# Patient Record
Sex: Female | Born: 1965
Health system: Southern US, Community
[De-identification: ages and names within clinical notes are randomized; demographics above are authoritative.]

## PROBLEM LIST (undated history)

## (undated) DIAGNOSIS — N200 Calculus of kidney: Secondary | ICD-10-CM

## (undated) DIAGNOSIS — M069 Rheumatoid arthritis, unspecified: Secondary | ICD-10-CM

## (undated) DIAGNOSIS — I10 Essential (primary) hypertension: Secondary | ICD-10-CM

## (undated) DIAGNOSIS — E079 Disorder of thyroid, unspecified: Secondary | ICD-10-CM

## (undated) HISTORY — DX: Rheumatoid arthritis, unspecified: M06.9

## (undated) HISTORY — DX: Calculus of kidney: N20.0

## (undated) HISTORY — PX: ABDOMINAL HYSTERECTOMY: SHX81

## (undated) HISTORY — DX: Disorder of thyroid, unspecified: E07.9

## (undated) HISTORY — PX: CHOLECYSTECTOMY: SHX55

## (undated) HISTORY — DX: Essential (primary) hypertension: I10

---

## 1999-11-10 ENCOUNTER — Other Ambulatory Visit: Admission: RE | Admit: 1999-11-10 | Discharge: 1999-11-10 | Payer: Self-pay | Admitting: Obstetrics and Gynecology

## 1999-11-29 ENCOUNTER — Encounter (INDEPENDENT_AMBULATORY_CARE_PROVIDER_SITE_OTHER): Payer: Self-pay | Admitting: Specialist

## 1999-11-29 ENCOUNTER — Ambulatory Visit (HOSPITAL_COMMUNITY): Admission: RE | Admit: 1999-11-29 | Discharge: 1999-11-29 | Payer: Self-pay | Admitting: Obstetrics and Gynecology

## 2000-12-06 ENCOUNTER — Other Ambulatory Visit: Admission: RE | Admit: 2000-12-06 | Discharge: 2000-12-06 | Payer: Self-pay | Admitting: Obstetrics and Gynecology

## 2001-01-14 ENCOUNTER — Encounter (INDEPENDENT_AMBULATORY_CARE_PROVIDER_SITE_OTHER): Payer: Self-pay | Admitting: Specialist

## 2001-01-14 ENCOUNTER — Inpatient Hospital Stay (HOSPITAL_COMMUNITY): Admission: RE | Admit: 2001-01-14 | Discharge: 2001-01-16 | Payer: Self-pay | Admitting: Obstetrics and Gynecology

## 2002-02-11 ENCOUNTER — Other Ambulatory Visit: Admission: RE | Admit: 2002-02-11 | Discharge: 2002-02-11 | Payer: Self-pay | Admitting: Obstetrics and Gynecology

## 2004-12-12 ENCOUNTER — Other Ambulatory Visit: Admission: RE | Admit: 2004-12-12 | Discharge: 2004-12-12 | Payer: Self-pay | Admitting: Obstetrics and Gynecology

## 2010-09-23 NOTE — Discharge Summary (Signed)
[facility]  Patient:    Hannah Lane, Hannah Lane Visit Number: [id number] MRN: [id number]          Service Type: GYN Location: 9300 [id number] Attending Physician:  Osborn Coho Dictated by:   Janeece Riggers Dareen Piano, M.D. Admit Date:  01/14/2001 Disc. Date: 01/16/01                             Discharge Summary  DISCHARGE DIAGNOSES:          1. Menorrhagia.                               2. Severe anemia.  PROCEDURE:                    Total vaginal hysterectomy.  HISTORY OF PRESENT ILLNESS:   [name] is a 45 year old white female G2, P2 who presented for total vaginal hysterectomy on January 14, 2001.  For a complete description of the events which lead up to this, please see the dictated operative note.  The patient underwent a total vaginal hysterectomy without complication on January 14, 2001.  A complete description of this procedure can be found in the dictated operative note.  Patients preoperative hemoglobin was 8.2.  Her postoperative hemoglobin was 6.5.  Patient was ambulating without difficulty and eating a regular diet throughout the entire postoperative course.  The patients pathology is pending at the time of this dictation.  The patient remained afebrile.  She was discharged to home on postoperative day #2.  She was sent home with Tylox.  She will return to the office in four weeks. Dictated by:   Janeece Riggers Dareen Piano, M.D. Attending Physician:  Osborn Coho DD:  01/16/01 TD:  01/16/01 Job: [id number]

## 2010-09-23 NOTE — H&P (Signed)
[facility]  Patient:    Hannah Lane, Hannah Lane Visit Number: [id number] MRN: [id number]          Service Type: GYN Location: 9300 [id number] Attending Physician:  Osborn Coho Dictated by:   Janeece Riggers Dareen Piano, M.D. Proc. Date: 01/14/01 Admit Date:  01/14/2001                           History and Physical  HISTORY OF PRESENT ILLNESS:   Ms. Tiegs is a 45 year old white female G 2, P 2, who presents today for a total vaginal hysterectomy secondary to menorrhagia causing severe anemia.  The patient has had problems with her menstrual cycles for five to seven years.  She has been treated with oral contraceptive pills, and a D&C with hysteroscopy.  The patient continues to bleed heavily despite using oral contraceptive pills.  She had little improvement after her D&C and hysteroscopy.  The hysteroscopy revealed no evidence of any polyps or uterine fibroids.  The patient does have a history of hypothyroidism, which is currently treated with Synthroid 75 mcg q.d.  The patient was offered a repeat D&C and hysteroscopy, endometrial ablation with roller ball, or a vaginal hysterectomy, and she desired to proceed on with a total vaginal hysterectomy.  The patients most recent hemoglobin is 8.0.  The patient complains of no bowel or bladder problems.  PAST MEDICAL HISTORY:         The patient has had two vaginal deliveries without complications.  The D&C and hysteroscopy, as listed above.  ALLERGIES:                    PENICILLIN.  SOCIAL HISTORY:               She denies smoking or drug use.  She uses alcohol occasionally.  FAMILY HISTORY:               The patient has a positive family history for hypertension, ovarian carcinoma, and cardiovascular disease.  PHYSICAL EXAMINATION:  VITAL SIGNS:                  Stable.  She is afebrile.  HEENT:                        Within normal limits.  LUNGS:                        Clear to  auscultation.  CARDIOVASCULAR:               A regular rate and rhythm without murmurs.  BREASTS:                      Without masses or tenderness.  There is no lymphadenopathy or lesion.  ABDOMEN:                      Soft, nontender, nondistended.  No palpable masses or organomegaly.  PELVIC:                       Normal external genitalia.  Vagina is without lesions or discharge.  Cervix is parous.  Uterus is anteverted, nontender. Normal size and shape.  There are no adnexal masses.  EXTREMITIES:  Within normal limits.  SKIN:                         Within normal limits.  IMPRESSION:                   1. Menorrhagia.                               2. Severe anemia.  PLAN:                         Proceed with a total vaginal hysterectomy. Dictated by:   Janeece Riggers Dareen Piano, M.D. Attending Physician:  Osborn Coho DD:  01/14/01 TD:  01/14/01 Job: [id number]

## 2010-09-23 NOTE — Op Note (Signed)
[facility]  Patient:    Hannah Lane, Hannah Lane                         MRN: [id number] Proc. Date: 11/29/99 Adm. Date:  [id number] Disc. Date: [id number] Attending:  Osborn Coho                           Operative Report  PREOPERATIVE DIAGNOSIS:       1. Menorrhagia.                               2. Severe anemia.  POSTOPERATIVE DIAGNOSIS:      1. Menorrhagia.                               2. Severe anemia.  PROCEDURE:                    1. Hysteroscopy.                               2. Dilatation and curettage.  SURGEON:                      Mark E. Dareen Piano, M.D.  ANESTHESIA:                   MAC with paracervical block.  ESTIMATED BLOOD LOSS:         25 cc.  COMPLICATIONS:                None.  SPECIMENS:                    Endocervical and endometrial curettings sent to pathology.  DESCRIPTION OF PROCEDURE:     The patient was taken to the operating room where she was placed in dorsal lithotomy position.  The MAC anesthesia was administered.  The patient was prepped with Hibiclens and her bladder was drained with a red rubber catheter.  She was prepped in the usual fashion for this procedure.  A sterile speculum was placed in the vagina.  A single-tooth tenaculum was applied to the anterior cervical lip.  Then 20 cc of 1% lidocaine used for a paracervical block.  The uterus was sounded at 9 cm.  The cervix was then dilated to a #29 [place].  A hysteroscope was placed into the endocervical canal which appeared to be normal.  While entering the uterine cavity, both ostia were visualized.  There was no evidence of any endometrial polyps or fibroids.  However, the endometrial lining was very thickened and tissue was seen heaped in several layers.  At this point, the hysteroscope was removed and an endocervical curettage was performed.  This was followed by an endometrial curetting where copious amounts of endometrial tissue was obtained.   All tissue was sent to pathology.  The patient tolerated the procedure well.  She was taken to the recovery room in stable condition. Instrument lap counts were correct x1.  The patient was given clindamycin ______ mg IV preoperatively. DD:  11/29/99 TD:  12/01/99 Job: [id number]

## 2010-09-23 NOTE — Op Note (Signed)
[facility]  Patient:    Hannah Lane, Hannah Lane Visit Number: [id number] MRN: [id number]          Service Type: GYN Location: 9300 [id number] Attending Physician:  Osborn Coho Dictated by:   Janeece Riggers Dareen Piano, M.D. Proc. Date: 01/14/01 Admit Date:  01/14/2001                             Operative Report  PREOPERATIVE DIAGNOSES:       1. Menomenorrhagia.                               2. Severe anemia.  POSTOPERATIVE DIAGNOSES:      1. Menomenorrhagia.                               2. Severe anemia.  PROCEDURE:                    Total vaginal hysterectomy.  SURGEON:                      Mark E. Dareen Piano, M.D.  ASSIST:                       Luvenia Redden, M.D.  ANESTHESIA:                   General.  ANTIBIOTICS:                  Ancef 1 g.  DRAINS:                       Foley to bed side drainage.  SPECIMEN:                     Cervix and uterus sent to pathology.  ESTIMATED BLOOD LOSS:         100 cc.  COMPLICATIONS:                None.  PROCEDURE:                    Patient was taken to the operating room where she was placed in the dorsal supine position.  A general anesthetic was administered without complications.  She was then placed in the dorsal lithotomy position and prepped with Hibiclens.  Her bladder was drained with a Red rubber catheter.  She was then draped in the usual fashion for this procedure.  A sterile speculum was placed in her vagina.  Single tooth tenaculum applied to the cervix.  The cervix was then injected with lidocaine with epinephrine.  The posterior cul-de-sac was then entered sharply.  The uterosacral ligaments were then bilaterally clamped, cut, and ligated with 0 Monocryl suture.  The anterior cul-de-sac was entered.  Cardinal ligaments were then sterilely clamped, cut, and ligated with 0 Monocryl suture.  The uterine vessels were bilaterally clamped, cut, and ligated with 0 Monocryl suture.  Once the  level of the round ligament was reached the uterus was inverted and the triple pedicle including the ovarian ligament, round ligament, fallopian tube were bilaterally clamped, cut and a specimen handed away.  These pedicles were then doubly ligated with 0 Monocryl suture.  All pedicles were then checked  and felt to be hemostatic.  The uterosacral ligaments were then reapproximated in the midline with 0 Monocryl suture.  The ovaries were inspected and appeared to be normal bilaterally.  The vaginal cuff was then closed using 2-0 Vicryl in a running locking fashion.  Patient tolerated the procedure well.  She was taken to the recovery room in stable condition.  Instrument, lap counts were correct x 2. Dictated by:   Janeece Riggers Dareen Piano, M.D. Attending Physician:  Osborn Coho DD:  01/14/01 TD:  01/14/01 Job: [id number]

## 2016-02-08 ENCOUNTER — Encounter: Payer: Self-pay | Admitting: Physician Assistant

## 2016-02-08 ENCOUNTER — Ambulatory Visit (INDEPENDENT_AMBULATORY_CARE_PROVIDER_SITE_OTHER): Payer: 59 | Admitting: Physician Assistant

## 2016-02-08 VITALS — BP 138/86 | HR 70 | Temp 98.1°F | Ht 63.0 in | Wt 221.5 lb

## 2016-02-08 DIAGNOSIS — M069 Rheumatoid arthritis, unspecified: Secondary | ICD-10-CM | POA: Insufficient documentation

## 2016-02-08 DIAGNOSIS — M0579 Rheumatoid arthritis with rheumatoid factor of multiple sites without organ or systems involvement: Secondary | ICD-10-CM

## 2016-02-08 DIAGNOSIS — M25561 Pain in right knee: Secondary | ICD-10-CM

## 2016-02-08 DIAGNOSIS — E538 Deficiency of other specified B group vitamins: Secondary | ICD-10-CM | POA: Diagnosis not present

## 2016-02-08 DIAGNOSIS — G8929 Other chronic pain: Secondary | ICD-10-CM

## 2016-02-08 DIAGNOSIS — E039 Hypothyroidism, unspecified: Secondary | ICD-10-CM

## 2016-02-08 DIAGNOSIS — I10 Essential (primary) hypertension: Secondary | ICD-10-CM | POA: Diagnosis not present

## 2016-02-08 NOTE — Progress Notes (Signed)
BP 138/86   Pulse 70   Temp 98.1 F (36.7 C) (Oral)   Ht 5\' 3"  (1.6 m)   Wt 221 lb 8 oz (100.5 kg)   BMI 39.24 kg/m    Subjective:    Patient ID: , female    DOB: 1966-02-12, 50 y.o.   MRN: [id number]  Hannah Lane is a 50 y.o. female presenting on 02/08/2016 for Knee Pain (right knee pain x 2 months, gets tight and stiff, seems to be worse when she lies or sits for long times)  HPI Patient here to be established as new patient at [facility].  This patient is known to me from [facility]. This patient has had chronic right knee pain for about 2 months. She takes Mobic and this keeps the rheumatoid arthritis calmed down. She is a patient at [facility] rheumatology. While she was there dermatologist felt that this knee was more isolated and more than likely due to some osteoarthritis. She has been icing it regularly. She has lost weight trying to have less pressure on it. It is very stiff after she has sat for very long. It is difficult to get in certain positions. She has no ability to the on her knees for work around her house. She is recently been taking some Tylenol in addition to the Mobic. She states that this has helped some. More than likely we need orthopedic consultation on this.  Patient is also here for recheck on her hypertension, hypothyroidism, vitamin B12 deficiency. All of her medications are reviewed today. There no refills needed at this time.   Relevant past medical, surgical, family and social history reviewed and updated as indicated. Interim medical history since our last visit reviewed. Allergies and medications reviewed and updated.   Data reviewed from any sources in EPIC.  Review of Systems  Constitutional: Negative.   HENT: Negative.   Eyes: Negative.   Respiratory: Negative.   Gastrointestinal: Negative.   Genitourinary: Negative.   Musculoskeletal: Positive for arthralgias, joint swelling and myalgias.     Per HPI unless specifically indicated above  Social History   Social History  . Marital status: Married    Spouse name: N/A  . Number of children: N/A  . Years of education: N/A   Occupational History  . Not on file.   Social History Main Topics  . Smoking status: Never Smoker  . Smokeless tobacco: Never Used  . Alcohol use No  . Drug use: No  . Sexual activity: Not on file   Other Topics Concern  . Not on file   Social History Narrative  . No narrative on file    Past Surgical History:  Procedure Laterality Date  . ABDOMINAL HYSTERECTOMY    . CHOLECYSTECTOMY      Family History  Problem Relation Age of Onset  . Diabetes Mother   . Hypertension Mother   . Kidney Stones Father   . Hydrocephalus Father   . Hypertension Brother   . Migraines Brother   . Kidney Stones Brother   . Parkinson's disease Maternal Grandmother   . Hypertension Maternal Grandmother   . Heart disease Maternal Grandmother   . Cancer Paternal Grandmother     ovarian  . Hydrocephalus Paternal Grandfather   . Hypertension Brother       Medication List       Accurate as of 02/08/16 10:28 AM. Always use your most recent med list.  levothyroxine 75 MCG tablet Commonly known as:  SYNTHROID, LEVOTHROID Take 75 mcg by mouth daily.   meloxicam 15 MG tablet Commonly known as:  MOBIC Take 15 mg by mouth daily.   multivitamin tablet Take 1 tablet by mouth daily.   valsartan 320 MG tablet Commonly known as:  DIOVAN Take 320 mg by mouth daily.   VITAMIN B-12 IJ Inject as directed every 30 (thirty) days.   XIIDRA 5 % Soln Generic drug:  Lifitegrast INSTILL 1 DROP TWICE A DAY INTO BOTH EYES          Objective:    BP 138/86   Pulse 70   Temp 98.1 F (36.7 C) (Oral)   Ht 5\' 3"  (1.6 m)   Wt 221 lb 8 oz (100.5 kg)   BMI 39.24 kg/m   No Known Allergies Wt Readings from Last 3 Encounters:  02/08/16 221 lb 8 oz (100.5 kg)    Physical Exam  Constitutional:  She is oriented to person, place, and time. She appears well-developed and well-nourished.  HENT:  Head: Normocephalic and atraumatic.  Eyes: Conjunctivae and EOM are normal. Pupils are equal, round, and reactive to light.  Cardiovascular: Normal rate, regular rhythm, normal heart sounds and intact distal pulses.   Pulmonary/Chest: Effort normal and breath sounds normal.  Abdominal: Soft. Bowel sounds are normal.  Musculoskeletal:       Right knee: She exhibits decreased range of motion and swelling. She exhibits no deformity, no erythema, no LCL laxity, normal patellar mobility, no bony tenderness and no MCL laxity.       Legs: Patient reports mild swelling on the popliteal space when she is having a lot of pain.  Neurological: She is alert and oriented to person, place, and time. She has normal reflexes.  Skin: Skin is warm and dry. No rash noted.  Psychiatric: She has a normal mood and affect. Her behavior is normal. Judgment and thought content normal.        Assessment & Plan:   1. Chronic pain of right knee - Ambulatory referral to Orthopedic Surgery  2. HTN (hypertension), benign - valsartan (DIOVAN) 320 MG tablet; Take 320 mg by mouth daily.  3. Rheumatoid arthritis involving multiple sites with positive rheumatoid factor (HCC) - meloxicam (MOBIC) 15 MG tablet; Take 15 mg by mouth daily.; Refill: 3  4. Hypothyroidism, unspecified type - levothyroxine (SYNTHROID, LEVOTHROID) 75 MCG tablet; Take 75 mcg by mouth daily.  5. Morbid obesity (HCC) Continue ketogenic type diet which is allow the patient to lose almost 20 pounds.  6. Vitamin B12 deficiency - Cyanocobalamin (VITAMIN B-12 IJ); Inject as directed every 30 (thirty) days.   Continue all other maintenance medications as listed above. Educational handout given for knee pain.  Follow up plan: Return in about 6 months (around 08/08/2016) for recheck.  10/08/2016 PA-C [facility] [address]  [place], [place] [phone]   02/08/2016, 10:28 AM

## 2016-02-08 NOTE — Patient Instructions (Signed)

## 2016-05-03 ENCOUNTER — Telehealth: Payer: Self-pay | Admitting: Physician Assistant

## 2016-05-03 MED ORDER — LEVOFLOXACIN 500 MG PO TABS: 500.0000 mg | ORAL_TABLET | Freq: Every day | ORAL | 0 refills | Status: DC

## 2016-05-03 NOTE — Telephone Encounter (Signed)
Prescription sent to pharmacy.

## 2016-05-22 DIAGNOSIS — M064 Inflammatory polyarthropathy: Secondary | ICD-10-CM | POA: Diagnosis not present

## 2016-05-22 DIAGNOSIS — M797 Fibromyalgia: Secondary | ICD-10-CM | POA: Diagnosis not present

## 2016-05-22 DIAGNOSIS — E790 Hyperuricemia without signs of inflammatory arthritis and tophaceous disease: Secondary | ICD-10-CM | POA: Diagnosis not present

## 2016-05-29 ENCOUNTER — Telehealth: Payer: Self-pay | Admitting: Physician Assistant

## 2016-05-29 MED ORDER — FLUCONAZOLE 150 MG PO TABS: 150.0000 mg | ORAL_TABLET | Freq: Once | ORAL | 2 refills | Status: AC

## 2016-05-29 NOTE — Telephone Encounter (Signed)
Prescription sent to pharmacy.

## 2016-06-20 ENCOUNTER — Other Ambulatory Visit: Payer: Self-pay | Admitting: Physician Assistant

## 2016-06-20 DIAGNOSIS — E039 Hypothyroidism, unspecified: Secondary | ICD-10-CM

## 2016-08-07 DIAGNOSIS — M064 Inflammatory polyarthropathy: Secondary | ICD-10-CM | POA: Diagnosis not present

## 2016-08-07 DIAGNOSIS — E790 Hyperuricemia without signs of inflammatory arthritis and tophaceous disease: Secondary | ICD-10-CM | POA: Diagnosis not present

## 2016-08-07 DIAGNOSIS — M2550 Pain in unspecified joint: Secondary | ICD-10-CM | POA: Diagnosis not present

## 2016-08-30 ENCOUNTER — Encounter: Payer: 59 | Admitting: Physician Assistant

## 2016-09-04 ENCOUNTER — Other Ambulatory Visit: Payer: Self-pay | Admitting: Physician Assistant

## 2016-09-13 ENCOUNTER — Ambulatory Visit (INDEPENDENT_AMBULATORY_CARE_PROVIDER_SITE_OTHER): Payer: 59 | Admitting: Physician Assistant

## 2016-09-13 ENCOUNTER — Encounter: Payer: Self-pay | Admitting: Physician Assistant

## 2016-09-13 VITALS — BP 120/70 | HR 72 | Temp 98.4°F | Ht 63.0 in | Wt 218.0 lb

## 2016-09-13 DIAGNOSIS — Z0000 Encounter for general adult medical examination without abnormal findings: Secondary | ICD-10-CM | POA: Diagnosis not present

## 2016-09-13 DIAGNOSIS — Z6841 Body Mass Index (BMI) 40.0 and over, adult: Secondary | ICD-10-CM | POA: Diagnosis not present

## 2016-09-13 DIAGNOSIS — E538 Deficiency of other specified B group vitamins: Secondary | ICD-10-CM

## 2016-09-13 DIAGNOSIS — I10 Essential (primary) hypertension: Secondary | ICD-10-CM

## 2016-09-13 MED ORDER — VALSARTAN 320 MG PO TABS: 320.0000 mg | ORAL_TABLET | Freq: Every day | ORAL | 3 refills | Status: DC

## 2016-09-13 MED ORDER — CYANOCOBALAMIN 1000 MCG/ML IJ SOLN: 1000.0000 ug | INTRAMUSCULAR | 3 refills | Status: DC

## 2016-09-13 NOTE — Patient Instructions (Signed)
Cyanocobalamin, Vitamin B12 injection What is this medicine? CYANOCOBALAMIN (sye an oh koe BAL a min) is a man made form of vitamin B12. Vitamin B12 is used in the growth of healthy blood cells, nerve cells, and proteins in the body. It also helps with the metabolism of fats and carbohydrates. This medicine is used to treat people who can not absorb vitamin B12. This medicine may be used for other purposes; ask your health care provider or pharmacist if you have questions. COMMON BRAND NAME(S): B-12 Compliance Kit, B-12 Injection Kit, Cyomin, LA-12, Nutri-Twelve, Physicians EZ Use B-12, Primabalt What should I tell my health care provider before I take this medicine? They need to know if you have any of these conditions: -kidney disease -Leber's disease -megaloblastic anemia -an unusual or allergic reaction to cyanocobalamin, cobalt, other medicines, foods, dyes, or preservatives -pregnant or trying to get pregnant -breast-feeding How should I use this medicine? This medicine is injected into a muscle or deeply under the skin. It is usually given by a health care professional in a clinic or doctor's office. However, your doctor may teach you how to inject yourself. Follow all instructions. Talk to your pediatrician regarding the use of this medicine in children. Special care may be needed. Overdosage: If you think you have taken too much of this medicine contact a poison control center or emergency room at once. NOTE: This medicine is only for you. Do not share this medicine with others. What if I miss a dose? If you are given your dose at a clinic or doctor's office, call to reschedule your appointment. If you give your own injections and you miss a dose, take it as soon as you can. If it is almost time for your next dose, take only that dose. Do not take double or extra doses. What may interact with this medicine? -colchicine -heavy alcohol intake This list may not describe all possible  interactions. Give your health care provider a list of all the medicines, herbs, non-prescription drugs, or dietary supplements you use. Also tell them if you smoke, drink alcohol, or use illegal drugs. Some items may interact with your medicine. What should I watch for while using this medicine? Visit your doctor or health care professional regularly. You may need blood work done while you are taking this medicine. You may need to follow a special diet. Talk to your doctor. Limit your alcohol intake and avoid smoking to get the best benefit. What side effects may I notice from receiving this medicine? Side effects that you should report to your doctor or health care professional as soon as possible: -allergic reactions like skin rash, itching or hives, swelling of the face, lips, or tongue -blue tint to skin -chest tightness, pain -difficulty breathing, wheezing -dizziness -red, swollen painful area on the leg Side effects that usually do not require medical attention (report to your doctor or health care professional if they continue or are bothersome): -diarrhea -headache This list may not describe all possible side effects. Call your doctor for medical advice about side effects. You may report side effects to FDA at 1-800-FDA-1088. Where should I keep my medicine? Keep out of the reach of children. Store at room temperature between 15 and 30 degrees C (59 and 85 degrees F). Protect from light. Throw away any unused medicine after the expiration date. NOTE: This sheet is a summary. It may not cover all possible information. If you have questions about this medicine, talk to your doctor, pharmacist, or   health care provider.  2018 Elsevier/Gold Standard (2007-08-05 22:10:20)  

## 2016-09-14 LAB — CBC WITH DIFFERENTIAL/PLATELET
BASOS ABS: 0.1 10*3/uL (ref 0.0–0.2)
Basos: 1 %
EOS (ABSOLUTE): 0.3 10*3/uL (ref 0.0–0.4)
Eos: 3 %
Hematocrit: 44.3 % (ref 34.0–46.6)
Hemoglobin: 14.5 g/dL (ref 11.1–15.9)
IMMATURE GRANS (ABS): 0 10*3/uL (ref 0.0–0.1)
IMMATURE GRANULOCYTES: 0 %
LYMPHS: 33 %
Lymphocytes Absolute: 3 10*3/uL (ref 0.7–3.1)
MCH: 29.1 pg (ref 26.6–33.0)
MCHC: 32.7 g/dL (ref 31.5–35.7)
MCV: 89 fL (ref 79–97)
Monocytes Absolute: 0.5 10*3/uL (ref 0.1–0.9)
Monocytes: 6 %
NEUTROS PCT: 57 %
Neutrophils Absolute: 5.2 10*3/uL (ref 1.4–7.0)
PLATELETS: 309 10*3/uL (ref 150–379)
RBC: 4.98 x10E6/uL (ref 3.77–5.28)
RDW: 12.7 % (ref 12.3–15.4)
WBC: 9 10*3/uL (ref 3.4–10.8)

## 2016-09-14 LAB — CMP14+EGFR
ALT: 18 IU/L (ref 0–32)
AST: 17 IU/L (ref 0–40)
Albumin/Globulin Ratio: 1.9 (ref 1.2–2.2)
Albumin: 4.5 g/dL (ref 3.5–5.5)
Alkaline Phosphatase: 104 IU/L (ref 39–117)
BUN/Creatinine Ratio: 21 (ref 9–23)
BUN: 13 mg/dL (ref 6–24)
Bilirubin Total: 0.2 mg/dL (ref 0.0–1.2)
CALCIUM: 9.7 mg/dL (ref 8.7–10.2)
CHLORIDE: 99 mmol/L (ref 96–106)
CO2: 25 mmol/L (ref 18–29)
Creatinine, Ser: 0.63 mg/dL (ref 0.57–1.00)
GFR, EST AFRICAN AMERICAN: 120 mL/min/{1.73_m2} (ref >59)
GFR, EST NON AFRICAN AMERICAN: 104 mL/min/{1.73_m2} (ref >59)
GLUCOSE: 89 mg/dL (ref 65–99)
Globulin, Total: 2.4 g/dL (ref 1.5–4.5)
Potassium: 4.3 mmol/L (ref 3.5–5.2)
Sodium: 138 mmol/L (ref 134–144)
TOTAL PROTEIN: 6.9 g/dL (ref 6.0–8.5)

## 2016-09-14 LAB — LIPID PANEL
CHOL/HDL RATIO: 4.2 ratio (ref 0.0–4.4)
Cholesterol, Total: 237 mg/dL — ABNORMAL HIGH (ref 100–199)
HDL: 56 mg/dL (ref >39)
LDL Calculated: 124 mg/dL — ABNORMAL HIGH (ref 0–99)
TRIGLYCERIDES: 283 mg/dL — AB (ref 0–149)
VLDL CHOLESTEROL CAL: 57 mg/dL — AB (ref 5–40)

## 2016-09-14 LAB — THYROID PANEL WITH TSH
FREE THYROXINE INDEX: 1.5 (ref 1.2–4.9)
T3 Uptake Ratio: 21 % — ABNORMAL LOW (ref 24–39)
T4, Total: 7.1 ug/dL (ref 4.5–12.0)
TSH: 3.64 u[IU]/mL (ref 0.450–4.500)

## 2016-09-14 LAB — VITAMIN B12: Vitamin B-12: 469 pg/mL (ref 232–1245)

## 2016-09-15 DIAGNOSIS — Z0000 Encounter for general adult medical examination without abnormal findings: Secondary | ICD-10-CM | POA: Insufficient documentation

## 2016-09-15 DIAGNOSIS — Z6841 Body Mass Index (BMI) 40.0 and over, adult: Secondary | ICD-10-CM

## 2016-09-15 NOTE — Progress Notes (Signed)
BP 120/70   Pulse 72   Temp 98.4 F (36.9 C) (Oral)   Ht 5' 3"  (1.6 m)   Wt 218 lb (98.9 kg)   BMI 38.62 kg/m    Subjective:    Patient ID: Hannah Lane, female    DOB: 05-Mar-1966, 51 y.o.   MRN: [id number]  HPI: Hannah Lane is a 51 y.o. female presenting on 09/13/2016 for Annual Exam (pt here today for CPE, no other concerns voiced.)  This patient comes in for annual well physical examination. All medications are reviewed today. There are no reports of any problems with the medications. All of the medical conditions are reviewed and updated.  Lab work is reviewed and will be ordered as medically necessary. There are no new problems reported with today's visit.  Patient reports doing well overall.  Past Medical History:  Diagnosis Date  . Hypertension   . Kidney stones   . Rheumatoid arthritis ([place])   . Thyroid disease    Relevant past medical, surgical, family and social history reviewed and updated as indicated. Interim medical history since our last visit reviewed. Allergies and medications reviewed and updated. DATA REVIEWED: CHART IN EPIC  Social History   Social History  . Marital status: Married    Spouse name: N/A  . Number of children: N/A  . Years of education: N/A   Occupational History  . Not on file.   Social History Main Topics  . Smoking status: Never Smoker  . Smokeless tobacco: Never Used  . Alcohol use No  . Drug use: No  . Sexual activity: Not on file   Other Topics Concern  . Not on file   Social History Narrative  . No narrative on file    Past Surgical History:  Procedure Laterality Date  . ABDOMINAL HYSTERECTOMY    . CHOLECYSTECTOMY      Family History  Problem Relation Age of Onset  . Diabetes Mother   . Hypertension Mother   . Kidney Stones Father   . Hydrocephalus Father   . Hypertension Brother   . Migraines Brother   . Kidney Stones Brother   . Parkinson's disease Maternal Grandmother   . Hypertension Maternal  Grandmother   . Heart disease Maternal Grandmother   . Cancer Paternal Grandmother        ovarian  . Hydrocephalus Paternal Grandfather   . Hypertension Brother     Review of Systems  Constitutional: Positive for fatigue. Negative for activity change and fever.  HENT: Negative.   Eyes: Negative.   Respiratory: Negative.  Negative for cough.   Cardiovascular: Negative.  Negative for chest pain.  Gastrointestinal: Positive for abdominal pain and diarrhea.  Endocrine: Negative.   Genitourinary: Negative.  Negative for dysuria.  Musculoskeletal: Positive for arthralgias, joint swelling and myalgias.  Skin: Negative.   Neurological: Negative.     Allergies as of 09/13/2016      Reactions   Penicillins Hives      Medication List       Accurate as of 09/13/16 11:59 PM. Always use your most recent med list.          cyanocobalamin 1000 MCG/ML injection Commonly known as:  (VITAMIN B-12) Inject 1 mL (1,000 mcg total) into the skin every 30 (thirty) days.   levothyroxine 75 MCG tablet Commonly known as:  SYNTHROID, LEVOTHROID TAKE ONE TABLET BY MOUTH ONE TIME DAILY   meloxicam 15 MG tablet Commonly known as:  MOBIC Take 15 mg  by mouth daily.   multivitamin tablet Take 1 tablet by mouth daily.   valsartan 320 MG tablet Commonly known as:  DIOVAN Take 1 tablet (320 mg total) by mouth daily.          Objective:    BP 120/70   Pulse 72   Temp 98.4 F (36.9 C) (Oral)   Ht 5' 3"  (1.6 m)   Wt 218 lb (98.9 kg)   BMI 38.62 kg/m   Allergies  Allergen Reactions  . Penicillins Hives    Wt Readings from Last 3 Encounters:  09/13/16 218 lb (98.9 kg)  02/08/16 221 lb 8 oz (100.5 kg)    Physical Exam  Constitutional: She is oriented to person, place, and time. She appears well-developed and well-nourished.  HENT:  Head: Normocephalic and atraumatic.  Right Ear: Tympanic membrane, external ear and ear canal normal.  Left Ear: Tympanic membrane, external ear and ear  canal normal.  Nose: Nose normal. No rhinorrhea.  Mouth/Throat: Oropharynx is clear and moist and mucous membranes are normal. No oropharyngeal exudate or posterior oropharyngeal erythema.  Eyes: Conjunctivae and EOM are normal. Pupils are equal, round, and reactive to light.  Neck: Normal range of motion. Neck supple.  Cardiovascular: Normal rate, regular rhythm, normal heart sounds and intact distal pulses.   Pulmonary/Chest: Effort normal and breath sounds normal.  Abdominal: Soft. Bowel sounds are normal.  Neurological: She is alert and oriented to person, place, and time. She has normal reflexes.  Skin: Skin is warm and dry. No rash noted.  Psychiatric: She has a normal mood and affect. Her behavior is normal. Judgment and thought content normal.  Nursing note and vitals reviewed.   Results for orders placed or performed in visit on 09/13/16  CBC with Differential/Platelet  Result Value Ref Range   WBC 9.0 3.4 - 10.8 x10E3/uL   RBC 4.98 3.77 - 5.28 x10E6/uL   Hemoglobin 14.5 11.1 - 15.9 g/dL   Hematocrit 44.3 34.0 - 46.6 %   MCV 89 79 - 97 fL   MCH 29.1 26.6 - 33.0 pg   MCHC 32.7 31.5 - 35.7 g/dL   RDW 12.7 12.3 - 15.4 %   Platelets 309 150 - 379 x10E3/uL   Neutrophils 57 Not Estab. %   Lymphs 33 Not Estab. %   Monocytes 6 Not Estab. %   Eos 3 Not Estab. %   Basos 1 Not Estab. %   Neutrophils Absolute 5.2 1.4 - 7.0 x10E3/uL   Lymphocytes Absolute 3.0 0.7 - 3.1 x10E3/uL   Monocytes Absolute 0.5 0.1 - 0.9 x10E3/uL   EOS (ABSOLUTE) 0.3 0.0 - 0.4 x10E3/uL   Basophils Absolute 0.1 0.0 - 0.2 x10E3/uL   Immature Granulocytes 0 Not Estab. %   Immature Grans (Abs) 0.0 0.0 - 0.1 x10E3/uL  CMP14+EGFR  Result Value Ref Range   Glucose 89 65 - 99 mg/dL   BUN 13 6 - 24 mg/dL   Creatinine, Ser 0.63 0.57 - 1.00 mg/dL   GFR calc non Af Amer 104 >59 mL/min/1.73   GFR calc Af Amer 120 >59 mL/min/1.73   BUN/Creatinine Ratio 21 9 - 23   Sodium 138 134 - 144 mmol/L   Potassium 4.3 3.5 -  5.2 mmol/L   Chloride 99 96 - 106 mmol/L   CO2 25 18 - 29 mmol/L   Calcium 9.7 8.7 - 10.2 mg/dL   Total Protein 6.9 6.0 - 8.5 g/dL   Albumin 4.5 3.5 - 5.5 g/dL   Globulin,  Total 2.4 1.5 - 4.5 g/dL   Albumin/Globulin Ratio 1.9 1.2 - 2.2   Bilirubin Total 0.2 0.0 - 1.2 mg/dL   Alkaline Phosphatase 104 39 - 117 IU/L   AST 17 0 - 40 IU/L   ALT 18 0 - 32 IU/L  Lipid panel  Result Value Ref Range   Cholesterol, Total 237 (H) 100 - 199 mg/dL   Triglycerides 283 (H) 0 - 149 mg/dL   HDL 56 >39 mg/dL   VLDL Cholesterol Cal 57 (H) 5 - 40 mg/dL   LDL Calculated 124 (H) 0 - 99 mg/dL   Chol/HDL Ratio 4.2 0.0 - 4.4 ratio  Thyroid Panel With TSH  Result Value Ref Range   TSH 3.640 0.450 - 4.500 uIU/mL   T4, Total 7.1 4.5 - 12.0 ug/dL   T3 Uptake Ratio 21 (L) 24 - 39 %   Free Thyroxine Index 1.5 1.2 - 4.9  Vitamin B12  Result Value Ref Range   Vitamin B-12 469 232 - 1,245 pg/mL      Assessment & Plan:   1. Well adult exam - CBC with Differential/Platelet - CMP14+EGFR - Lipid panel - Thyroid Panel With TSH - Vitamin B12  2. Vitamin B12 deficiency - cyanocobalamin (,VITAMIN B-12,) 1000 MCG/ML injection; Inject 1 mL (1,000 mcg total) into the skin every 30 (thirty) days.  Dispense: 3 mL; Refill: 3  3. HTN (hypertension), benign - valsartan (DIOVAN) 320 MG tablet; Take 1 tablet (320 mg total) by mouth daily.  Dispense: 90 tablet; Refill: 3  4. Class 3 severe obesity Lane to excess calories with serious comorbidity and body mass index (BMI) of 40.0 to 44.9 in adult [facility]) - Amb Referral to Bariatric Surgery   Current Outpatient Prescriptions:  .  cyanocobalamin (,VITAMIN B-12,) 1000 MCG/ML injection, Inject 1 mL (1,000 mcg total) into the skin every 30 (thirty) days., Disp: 3 mL, Rfl: 3 .  levothyroxine (SYNTHROID, LEVOTHROID) 75 MCG tablet, TAKE ONE TABLET BY MOUTH ONE TIME DAILY, Disp: 30 tablet, Rfl: 11 .  meloxicam (MOBIC) 15 MG tablet, Take 15 mg by mouth daily., Disp: , Rfl: 3 .   Multiple Vitamin (MULTIVITAMIN) tablet, Take 1 tablet by mouth daily., Disp: , Rfl:  .  valsartan (DIOVAN) 320 MG tablet, Take 1 tablet (320 mg total) by mouth daily., Disp: 90 tablet, Rfl: 3  Continue all other maintenance medications as listed above.  Follow up plan: Return in about 6 months (around 03/16/2017) for recheck med.  Educational handout given for B12 deficiency  Terald Sleeper PA-C [place] [address]  [place], [place] [address] [phone]   09/15/2016, 10:37 AM

## 2016-11-16 DIAGNOSIS — M0609 Rheumatoid arthritis without rheumatoid factor, multiple sites: Secondary | ICD-10-CM | POA: Diagnosis not present

## 2016-11-16 DIAGNOSIS — E790 Hyperuricemia without signs of inflammatory arthritis and tophaceous disease: Secondary | ICD-10-CM | POA: Diagnosis not present

## 2016-11-16 DIAGNOSIS — M797 Fibromyalgia: Secondary | ICD-10-CM | POA: Diagnosis not present

## 2016-11-24 ENCOUNTER — Telehealth: Payer: Self-pay | Admitting: Physician Assistant

## 2016-11-24 MED ORDER — "NEEDLE (DISP) 25G X 1"" MISC": 1.0000 | 3 refills | Status: DC

## 2016-11-24 MED ORDER — SYRINGE 2-3 ML 3 ML MISC: 1.0000 | 3 refills | Status: DC

## 2016-11-24 NOTE — Telephone Encounter (Signed)
[facility] Okayed per Prudy Feeler

## 2016-11-24 NOTE — Telephone Encounter (Signed)
Please reviw and advise

## 2016-11-24 NOTE — Telephone Encounter (Signed)
Can you build the script and I will send?  You know the correct size of needle and syringe.

## 2016-12-03 ENCOUNTER — Other Ambulatory Visit: Payer: Self-pay | Admitting: Physician Assistant

## 2016-12-03 MED ORDER — OLMESARTAN MEDOXOMIL 40 MG PO TABS: 40.0000 mg | ORAL_TABLET | Freq: Every day | ORAL | 3 refills | Status: DC

## 2017-02-01 DIAGNOSIS — G4733 Obstructive sleep apnea (adult) (pediatric): Secondary | ICD-10-CM | POA: Diagnosis not present

## 2017-02-02 ENCOUNTER — Telehealth: Payer: Self-pay | Admitting: Physician Assistant

## 2017-02-02 DIAGNOSIS — K921 Melena: Secondary | ICD-10-CM

## 2017-02-02 NOTE — Telephone Encounter (Signed)
Pt has some health health concerns wondering if she should see specialist. Pt would not give any more information only wanted to speak with [facility]. Please advise

## 2017-02-02 NOTE — Telephone Encounter (Signed)
Does she feel that she is having hemorrhoids? Or more of a colitis and may need asacol.

## 2017-02-02 NOTE — Telephone Encounter (Signed)
We can also make gastro referral.

## 2017-02-02 NOTE — Telephone Encounter (Signed)
Patient is having blood in stool. Patient states that she has had this problem on and off. Now as of this week it is doing it on a daily basis and is much heavier. Patient wants to know what you would recommend for her to do?

## 2017-02-02 NOTE — Telephone Encounter (Signed)
Patient aware and referral placed.

## 2017-02-05 DIAGNOSIS — K625 Hemorrhage of anus and rectum: Secondary | ICD-10-CM | POA: Diagnosis not present

## 2017-02-15 DIAGNOSIS — M797 Fibromyalgia: Secondary | ICD-10-CM | POA: Diagnosis not present

## 2017-02-15 DIAGNOSIS — M064 Inflammatory polyarthropathy: Secondary | ICD-10-CM | POA: Diagnosis not present

## 2017-02-15 DIAGNOSIS — M2550 Pain in unspecified joint: Secondary | ICD-10-CM | POA: Diagnosis not present

## 2017-02-26 DIAGNOSIS — K6389 Other specified diseases of intestine: Secondary | ICD-10-CM | POA: Diagnosis not present

## 2017-02-26 DIAGNOSIS — K625 Hemorrhage of anus and rectum: Secondary | ICD-10-CM | POA: Diagnosis not present

## 2017-03-15 ENCOUNTER — Encounter: Payer: Self-pay | Admitting: Physician Assistant

## 2017-03-15 ENCOUNTER — Ambulatory Visit (INDEPENDENT_AMBULATORY_CARE_PROVIDER_SITE_OTHER): Payer: 59 | Admitting: Physician Assistant

## 2017-03-15 VITALS — BP 125/76 | HR 78 | Temp 97.9°F | Ht 63.0 in | Wt 226.6 lb

## 2017-03-15 DIAGNOSIS — E8881 Metabolic syndrome: Secondary | ICD-10-CM

## 2017-03-15 DIAGNOSIS — Z6838 Body mass index (BMI) 38.0-38.9, adult: Secondary | ICD-10-CM | POA: Diagnosis not present

## 2017-03-15 DIAGNOSIS — R635 Abnormal weight gain: Secondary | ICD-10-CM | POA: Diagnosis not present

## 2017-03-15 LAB — BAYER DCA HB A1C WAIVED: HB A1C: 5.3 % (ref <7.0)

## 2017-03-15 MED ORDER — LIRAGLUTIDE -WEIGHT MANAGEMENT 18 MG/3ML ~~LOC~~ SOPN: 3.0000 mg | PEN_INJECTOR | Freq: Every day | SUBCUTANEOUS | 11 refills | Status: DC

## 2017-03-15 NOTE — Patient Instructions (Signed)
In a few days you may receive a survey in the mail or online from [employer] regarding your visit with us today. Please take a moment to fill this out. Your feedback is very important to our whole office. It can help us better understand your needs as well as improve your experience and satisfaction. Thank you for taking your time to complete it. We care about you.  Mardella Nuckles, PA-C  

## 2017-03-15 NOTE — Progress Notes (Signed)
BP 125/76   Pulse 78   Temp 97.9 F (36.6 C) (Oral)   Ht _0  (1.6 m)   Wt 226 lb 9.6 oz (102.8 kg)   BMI 40.14 kg/m    Subjective:    Patient ID: Hannah Lane, female    DOB: [birth year], 51 y.o.   MRN: [id number]  HPI: Hannah Lane is a 51 y.o. female presenting on 03/15/2017 for discuss weight loss  This patient comes in for discussion about weight loss.  She has tried many things over the years.  Back in her 80s is when she began to have problems with being obese.  At that time she tried the phentermine fenfluramine combination.  She did lose some weight but after she stopped gained it back.  She has tried weight watchers multiple times.  She is frequently tried low-carb type diets including keto diet.  She is also been heavily involved in exercise and diet.  She is able to lose them but quickly regains.  She has a long family history of diabetes and cardiac issues.  She herself has rheumatoid arthritis and has difficulty with as much exercising now.  She is chronically obese and we have discussed Saxenda as a possibility of taking.    She reports having a decreased time of feeling full.  She will often have strong cravings throughout the day.  If she is sitting somewhere there is food she will eat when she is not hungry.  She is not having any excessive morning hunger.  She is thirsty most of the day.  She has nocturia one time a night.  The prescription will be sent and labs will be drawn today.  Relevant past medical, surgical, family and social history reviewed and updated as indicated. Allergies and medications reviewed and updated.  Past Medical History:  Diagnosis Date  . Hypertension   . Kidney stones   . Rheumatoid arthritis ([place])   . Thyroid disease     Past Surgical History:  Procedure Laterality Date  . ABDOMINAL HYSTERECTOMY    . CHOLECYSTECTOMY      Review of Systems  Constitutional: Positive for fatigue and unexpected weight change. Negative for activity  change and fever.  HENT: Negative.   Eyes: Negative.   Respiratory: Negative.  Negative for cough.   Cardiovascular: Negative.  Negative for chest pain.  Gastrointestinal: Negative.  Negative for abdominal pain.  Endocrine: Negative.  Negative for heat intolerance, polyphagia and polyuria.  Genitourinary: Negative.  Negative for dysuria.  Musculoskeletal: Positive for arthralgias, back pain, joint swelling and myalgias.  Skin: Negative.   Neurological: Negative.     Allergies as of 03/15/2017      Reactions   Penicillins Hives      Medication List        Accurate as of 03/15/17  3:28 PM. Always use your most recent med list.          2-3CC SYRINGE 3 ML Misc 1 each by Does not apply route every 28 (twenty-eight) days.   cyanocobalamin 1000 MCG/ML injection Commonly known as:  (VITAMIN B-12) Inject 1 mL (1,000 mcg total) into the skin every 30 (thirty) days.   levothyroxine 75 MCG tablet Commonly known as:  SYNTHROID, LEVOTHROID TAKE ONE TABLET BY MOUTH ONE TIME DAILY   Liraglutide -Weight Management 18 MG/3ML Sopn Commonly known as:  SAXENDA Inject 3 mg daily into the skin.   meloxicam 15 MG tablet Commonly known as:  MOBIC Take 15 mg by  mouth daily.   multivitamin tablet Take 1 tablet by mouth daily.   NEEDLE (DISP) 25 G 25G X 1" Misc 1 each by Does not apply route every 28 (twenty-eight) days.   olmesartan 40 MG tablet Commonly known as:  BENICAR Take 1 tablet (40 mg total) by mouth daily.          Objective:    BP 125/76   Pulse 78   Temp 97.9 F (36.6 C) (Oral)   Ht _0  (1.6 m)   Wt 226 lb 9.6 oz (102.8 kg)   BMI 40.14 kg/m   Allergies  Allergen Reactions  . Penicillins Hives    Physical Exam  Constitutional: She is oriented to person, place, and time. She appears well-developed and well-nourished.  HENT:  Head: Normocephalic and atraumatic.  Eyes: Conjunctivae and EOM are normal. Pupils are equal, round, and reactive to light.    Cardiovascular: Normal rate, regular rhythm, normal heart sounds and intact distal pulses.  Pulmonary/Chest: Effort normal and breath sounds normal.  Abdominal: Soft. Bowel sounds are normal.  Neurological: She is alert and oriented to person, place, and time. She has normal reflexes.  Skin: Skin is warm and dry. No rash noted.  Psychiatric: She has a normal mood and affect. Her behavior is normal. Judgment and thought content normal.  Nursing note and vitals reviewed.   Results for orders placed or performed in visit on 03/15/17  Bayer DCA Hb A1c Waived  Result Value Ref Range   Bayer DCA Hb A1c Waived 5.3 <7.0 %      Assessment & Plan:   1. Weight gain - CBC with Differential/Platelet - CMP14+EGFR - Lipid panel - Bayer DCA Hb A1c Waived  2. BMI 38.0-38.9,adult - Liraglutide -Weight Management (SAXENDA) 18 MG/3ML SOPN; Inject 3 mg daily into the skin.  Dispense: 15 mL; Refill: 11 - CBC with Differential/Platelet - CMP14+EGFR - Lipid panel - Bayer DCA Hb A1c Waived  3. Metabolic syndrome - Liraglutide -Weight Management (SAXENDA) 18 MG/3ML SOPN; Inject 3 mg daily into the skin.  Dispense: 15 mL; Refill: 11 - CBC with Differential/Platelet - CMP14+EGFR - Lipid panel - Bayer DCA Hb A1c Waived   Current Outpatient Medications:  .  cyanocobalamin (,VITAMIN B-12,) 1000 MCG/ML injection, Inject 1 mL (1,000 mcg total) into the skin every 30 (thirty) days., Disp: 3 mL, Rfl: 3 .  levothyroxine (SYNTHROID, LEVOTHROID) 75 MCG tablet, TAKE ONE TABLET BY MOUTH ONE TIME DAILY, Disp: 30 tablet, Rfl: 11 .  meloxicam (MOBIC) 15 MG tablet, Take 15 mg by mouth daily., Disp: , Rfl: 3 .  Multiple Vitamin (MULTIVITAMIN) tablet, Take 1 tablet by mouth daily., Disp: , Rfl:  .  NEEDLE, DISP, 25 G 25G X 1" MISC, 1 each by Does not apply route every 28 (twenty-eight) days., Disp: 4 each, Rfl: 3 .  olmesartan (BENICAR) 40 MG tablet, Take 1 tablet (40 mg total) by mouth daily., Disp: 90 tablet, Rfl:  3 .  Syringe, Disposable, (2-3CC SYRINGE) 3 ML MISC, 1 each by Does not apply route every 28 (twenty-eight) days., Disp: 4 each, Rfl: 3 .  Liraglutide -Weight Management (SAXENDA) 18 MG/3ML SOPN, Inject 3 mg daily into the skin., Disp: 15 mL, Rfl: 11 Continue all other maintenance medications as listed above.  Follow up plan: Return in about 6 weeks (around 04/26/2017) for recheck.  Educational handout given for [place] PA-C [place] [address]  [place], [place] [address] [phone]  03/15/2017, 3:28 PM

## 2017-03-16 LAB — CMP14+EGFR
ALBUMIN: 4.4 g/dL (ref 3.5–5.5)
ALT: 25 IU/L (ref 0–32)
AST: 24 IU/L (ref 0–40)
Albumin/Globulin Ratio: 1.8 (ref 1.2–2.2)
Alkaline Phosphatase: 111 IU/L (ref 39–117)
BUN / CREAT RATIO: 19 (ref 9–23)
BUN: 13 mg/dL (ref 6–24)
Bilirubin Total: <0.2 mg/dL (ref 0.0–1.2)
CALCIUM: 9.8 mg/dL (ref 8.7–10.2)
CO2: 23 mmol/L (ref 20–29)
CREATININE: 0.67 mg/dL (ref 0.57–1.00)
Chloride: 104 mmol/L (ref 96–106)
GFR calc Af Amer: 117 mL/min/{1.73_m2} (ref >59)
GFR calc non Af Amer: 101 mL/min/{1.73_m2} (ref >59)
GLOBULIN, TOTAL: 2.4 g/dL (ref 1.5–4.5)
Glucose: 91 mg/dL (ref 65–99)
Potassium: 4.6 mmol/L (ref 3.5–5.2)
SODIUM: 142 mmol/L (ref 134–144)
Total Protein: 6.8 g/dL (ref 6.0–8.5)

## 2017-03-16 LAB — LIPID PANEL
CHOL/HDL RATIO: 4.5 ratio — AB (ref 0.0–4.4)
Cholesterol, Total: 252 mg/dL — ABNORMAL HIGH (ref 100–199)
HDL: 56 mg/dL (ref >39)
LDL CALC: 142 mg/dL — AB (ref 0–99)
Triglycerides: 270 mg/dL — ABNORMAL HIGH (ref 0–149)
VLDL Cholesterol Cal: 54 mg/dL — ABNORMAL HIGH (ref 5–40)

## 2017-03-16 LAB — CBC WITH DIFFERENTIAL/PLATELET
BASOS: 1 %
Basophils Absolute: 0.1 10*3/uL (ref 0.0–0.2)
EOS (ABSOLUTE): 0.2 10*3/uL (ref 0.0–0.4)
EOS: 2 %
HEMATOCRIT: 43 % (ref 34.0–46.6)
HEMOGLOBIN: 14.2 g/dL (ref 11.1–15.9)
IMMATURE GRANULOCYTES: 1 %
Immature Grans (Abs): 0.1 10*3/uL (ref 0.0–0.1)
LYMPHS ABS: 2.6 10*3/uL (ref 0.7–3.1)
Lymphs: 28 %
MCH: 28.7 pg (ref 26.6–33.0)
MCHC: 33 g/dL (ref 31.5–35.7)
MCV: 87 fL (ref 79–97)
MONOCYTES: 6 %
Monocytes Absolute: 0.6 10*3/uL (ref 0.1–0.9)
Neutrophils Absolute: 6 10*3/uL (ref 1.4–7.0)
Neutrophils: 62 %
Platelets: 327 10*3/uL (ref 150–379)
RBC: 4.94 x10E6/uL (ref 3.77–5.28)
RDW: 13.5 % (ref 12.3–15.4)
WBC: 9.5 10*3/uL (ref 3.4–10.8)

## 2017-06-11 ENCOUNTER — Other Ambulatory Visit: Payer: Self-pay | Admitting: Physician Assistant

## 2017-06-11 MED ORDER — ALBUTEROL SULFATE HFA 108 (90 BASE) MCG/ACT IN AERS: 2.0000 | INHALATION_SPRAY | Freq: Four times a day (QID) | RESPIRATORY_TRACT | 2 refills | Status: DC | PRN

## 2017-06-11 MED ORDER — DOXYCYCLINE HYCLATE 100 MG PO TABS: 100.0000 mg | ORAL_TABLET | Freq: Two times a day (BID) | ORAL | 0 refills | Status: DC

## 2017-06-27 ENCOUNTER — Other Ambulatory Visit: Payer: Self-pay | Admitting: Physician Assistant

## 2017-06-27 DIAGNOSIS — E039 Hypothyroidism, unspecified: Secondary | ICD-10-CM

## 2017-07-25 ENCOUNTER — Encounter: Payer: Self-pay | Admitting: Family Medicine

## 2017-07-25 ENCOUNTER — Ambulatory Visit (INDEPENDENT_AMBULATORY_CARE_PROVIDER_SITE_OTHER): Payer: 59 | Admitting: Family Medicine

## 2017-07-25 VITALS — BP 128/81 | HR 79 | Temp 97.4°F | Ht 63.0 in | Wt 224.0 lb

## 2017-07-25 DIAGNOSIS — R300 Dysuria: Secondary | ICD-10-CM

## 2017-07-25 LAB — URINALYSIS
BILIRUBIN UA: NEGATIVE
GLUCOSE, UA: NEGATIVE
Ketones, UA: NEGATIVE
Nitrite, UA: NEGATIVE
PH UA: 6 (ref 5.0–7.5)
PROTEIN UA: NEGATIVE
RBC, UA: NEGATIVE
Specific Gravity, UA: <1.005 — ABNORMAL LOW (ref 1.005–1.030)
Urobilinogen, Ur: 0.2 mg/dL (ref 0.2–1.0)

## 2017-07-25 MED ORDER — CEPHALEXIN 500 MG PO CAPS: 500.0000 mg | ORAL_CAPSULE | Freq: Four times a day (QID) | ORAL | 0 refills | Status: DC

## 2017-07-25 NOTE — Progress Notes (Signed)
BP 128/81   Pulse 79   Temp (!) 97.4 F (36.3 C) (Oral)   Ht 5\' 3"  (1.6 m)   Wt 224 lb (101.6 kg)   BMI 39.68 kg/m    Subjective:    Patient ID: , female    DOB: March 12, 1966, 52 y.o.   MRN: [id number]  HPI: Hannah Lane is a 52 y.o. female presenting on 07/25/2017 for Urinary Tract Infection (low back pain, urinary frequency, urine darker than usual)   HPI Patient comes in complaining of dysuria and frequency and low back pain that has been going on for about a week.  She says it has really worsened over the past couple nights she has been urinating up to 6 times a night over the past couple nights.  She denies any fevers or chills but has had some low back pain but does have chronic rheumatoid-like pain so she does not know if it is from that or something else.  She denies any blood in her urine.  She has been trying to increase her fluid intake to decrease it but has not been helping  Relevant past medical, surgical, family and social history reviewed and updated as indicated. Interim medical history since our last visit reviewed. Allergies and medications reviewed and updated.  Review of Systems  Constitutional: Negative for chills and fever.  Eyes: Negative for visual disturbance.  Respiratory: Negative for chest tightness and shortness of breath.   Cardiovascular: Negative for chest pain and leg swelling.  Gastrointestinal: Negative for abdominal pain.  Genitourinary: Positive for dysuria, flank pain and frequency. Negative for difficulty urinating and hematuria.  Musculoskeletal: Negative for back pain and gait problem.  Skin: Negative for rash.  Neurological: Negative for light-headedness and headaches.  Psychiatric/Behavioral: Negative for agitation and behavioral problems.  All other systems reviewed and are negative.   Per HPI unless specifically indicated above   Allergies as of 07/25/2017      Reactions   Penicillins Hives      Medication List        Accurate as of 07/25/17  5:35 PM. Always use your most recent med list.          2-3CC SYRINGE 3 ML Misc 1 each by Does not apply route every 28 (twenty-eight) days.   albuterol 108 (90 Base) MCG/ACT inhaler Commonly known as:  PROVENTIL HFA;VENTOLIN HFA Inhale 2 puffs into the lungs every 6 (six) hours as needed for wheezing or shortness of breath.   cephALEXin 500 MG capsule Commonly known as:  KEFLEX Take 1 capsule (500 mg total) by mouth 4 (four) times daily.   cyanocobalamin 1000 MCG/ML injection Commonly known as:  (VITAMIN B-12) Inject 1 mL (1,000 mcg total) into the skin every 30 (thirty) days.   levothyroxine 75 MCG tablet Commonly known as:  SYNTHROID, LEVOTHROID TAKE ONE TABLET BY MOUTH ONE TIME DAILY   Liraglutide -Weight Management 18 MG/3ML Sopn Commonly known as:  SAXENDA Inject 3 mg daily into the skin.   meloxicam 15 MG tablet Commonly known as:  MOBIC Take 15 mg by mouth daily.   multivitamin tablet Take 1 tablet by mouth daily.   NEEDLE (DISP) 25 G 25G X 1" Misc 1 each by Does not apply route every 28 (twenty-eight) days.   olmesartan 40 MG tablet Commonly known as:  BENICAR Take 1 tablet (40 mg total) by mouth daily.          Objective:    BP 128/81  Pulse 79   Temp (!) 97.4 F (36.3 C) (Oral)   Ht 5\' 3"  (1.6 m)   Wt 224 lb (101.6 kg)   BMI 39.68 kg/m   Wt Readings from Last 3 Encounters:  07/25/17 224 lb (101.6 kg)  03/15/17 226 lb 9.6 oz (102.8 kg)  09/13/16 218 lb (98.9 kg)    Physical Exam  Constitutional: She is oriented to person, place, and time. She appears well-developed and well-nourished. No distress.  Eyes: Conjunctivae are normal.  Cardiovascular: Normal rate, regular rhythm, normal heart sounds and intact distal pulses.  No murmur heard. Pulmonary/Chest: Effort normal and breath sounds normal. No respiratory distress. She has no wheezes. She has no rales.  Abdominal: Soft. Bowel sounds are normal. She exhibits no  distension. There is no tenderness. There is no rebound and no guarding.  Neurological: She is alert and oriented to person, place, and time. Coordination normal.  Skin: Skin is warm and dry. No rash noted. She is not diaphoretic.  Psychiatric: She has a normal mood and affect. Her behavior is normal.  Nursing note and vitals reviewed.   Urinalysis, dip only: Trace leukocytes, otherwise negative    Assessment & Plan:   Problem List Items Addressed This Visit    None    Visit Diagnoses    Dysuria    -  Primary   Relevant Medications   cephALEXin (KEFLEX) 500 MG capsule   Other Relevant Orders   Urinalysis       Follow up plan: Return if symptoms worsen or fail to improve.  Counseling provided for all of the vaccine components Orders Placed This Encounter  Procedures  . Urinalysis    11/13/16, MD [facility] 07/25/2017, 5:35 PM

## 2017-08-15 DIAGNOSIS — L4050 Arthropathic psoriasis, unspecified: Secondary | ICD-10-CM | POA: Diagnosis not present

## 2017-08-15 DIAGNOSIS — M0600 Rheumatoid arthritis without rheumatoid factor, unspecified site: Secondary | ICD-10-CM | POA: Diagnosis not present

## 2017-08-15 DIAGNOSIS — M064 Inflammatory polyarthropathy: Secondary | ICD-10-CM | POA: Diagnosis not present

## 2017-10-10 ENCOUNTER — Other Ambulatory Visit: Payer: Self-pay | Admitting: Physician Assistant

## 2017-10-11 ENCOUNTER — Telehealth: Payer: Self-pay | Admitting: *Deleted

## 2017-10-12 ENCOUNTER — Other Ambulatory Visit: Payer: Self-pay | Admitting: Physician Assistant

## 2017-10-12 MED ORDER — ALBUTEROL SULFATE HFA 108 (90 BASE) MCG/ACT IN AERS: 2.0000 | INHALATION_SPRAY | Freq: Four times a day (QID) | RESPIRATORY_TRACT | 5 refills | Status: DC | PRN

## 2017-10-12 NOTE — Telephone Encounter (Signed)
sent 

## 2017-10-12 NOTE — Telephone Encounter (Signed)
How do I send it?

## 2017-10-12 NOTE — Telephone Encounter (Signed)
Just re-write the rx and send in epic - with needed infor in the pharm note

## 2017-11-22 DIAGNOSIS — M0600 Rheumatoid arthritis without rheumatoid factor, unspecified site: Secondary | ICD-10-CM | POA: Diagnosis not present

## 2017-11-22 DIAGNOSIS — E790 Hyperuricemia without signs of inflammatory arthritis and tophaceous disease: Secondary | ICD-10-CM | POA: Diagnosis not present

## 2017-11-22 DIAGNOSIS — M2550 Pain in unspecified joint: Secondary | ICD-10-CM | POA: Diagnosis not present

## 2017-11-22 DIAGNOSIS — M797 Fibromyalgia: Secondary | ICD-10-CM | POA: Diagnosis not present

## 2017-12-06 ENCOUNTER — Telehealth: Payer: Self-pay | Admitting: Physician Assistant

## 2017-12-06 DIAGNOSIS — E039 Hypothyroidism, unspecified: Secondary | ICD-10-CM

## 2017-12-06 MED ORDER — LEVOTHYROXINE SODIUM 75 MCG PO TABS
75.0000 ug | ORAL_TABLET | Freq: Every day | ORAL | 1 refills | Status: DC
Start: 2017-12-06 — End: 2018-03-22

## 2017-12-06 NOTE — Telephone Encounter (Signed)
Pt had 6 months worth of refills on her levothyroxine but wanted a 90 day supply with 1 refill sent to [facility]. Rx sent to pharmacy as requested.

## 2017-12-06 NOTE — Telephone Encounter (Signed)
What is the name of the medication? levothyroxine (SYNTHROID, LEVOTHROID) 75 MCG tablet  Have you contacted your pharmacy to request a refill? no  Which pharmacy would you like this sent to? [facility]   Patient notified that their request is being sent to the clinical staff for review and that they should receive a call once it is complete. If they do not receive a call within 24 hours they can check with their pharmacy or our office.

## 2017-12-12 ENCOUNTER — Other Ambulatory Visit: Payer: Self-pay | Admitting: Physician Assistant

## 2017-12-12 DIAGNOSIS — E538 Deficiency of other specified B group vitamins: Secondary | ICD-10-CM

## 2017-12-13 ENCOUNTER — Other Ambulatory Visit: Payer: Self-pay | Admitting: Physician Assistant

## 2017-12-19 DIAGNOSIS — Z01419 Encounter for gynecological examination (general) (routine) without abnormal findings: Secondary | ICD-10-CM | POA: Diagnosis not present

## 2017-12-20 ENCOUNTER — Other Ambulatory Visit: Payer: Self-pay | Admitting: Obstetrics and Gynecology

## 2017-12-20 DIAGNOSIS — R928 Other abnormal and inconclusive findings on diagnostic imaging of breast: Secondary | ICD-10-CM

## 2017-12-26 ENCOUNTER — Ambulatory Visit
Admission: RE | Admit: 2017-12-26 | Discharge: 2017-12-26 | Disposition: A | Discharge status: Home / Self Care | Payer: 59 | Source: Ambulatory Visit | Attending: Obstetrics and Gynecology | Admitting: Obstetrics and Gynecology

## 2017-12-26 DIAGNOSIS — R928 Other abnormal and inconclusive findings on diagnostic imaging of breast: Secondary | ICD-10-CM | POA: Diagnosis not present

## 2017-12-26 DIAGNOSIS — N6489 Other specified disorders of breast: Secondary | ICD-10-CM | POA: Diagnosis not present

## 2017-12-31 DIAGNOSIS — H40013 Open angle with borderline findings, low risk, bilateral: Secondary | ICD-10-CM | POA: Diagnosis not present

## 2018-02-18 DIAGNOSIS — E790 Hyperuricemia without signs of inflammatory arthritis and tophaceous disease: Secondary | ICD-10-CM | POA: Diagnosis not present

## 2018-02-18 DIAGNOSIS — M0600 Rheumatoid arthritis without rheumatoid factor, unspecified site: Secondary | ICD-10-CM | POA: Diagnosis not present

## 2018-02-18 DIAGNOSIS — M2550 Pain in unspecified joint: Secondary | ICD-10-CM | POA: Diagnosis not present

## 2018-02-18 DIAGNOSIS — R0602 Shortness of breath: Secondary | ICD-10-CM | POA: Diagnosis not present

## 2018-03-20 ENCOUNTER — Other Ambulatory Visit: Payer: Self-pay | Admitting: Physician Assistant

## 2018-03-20 DIAGNOSIS — E538 Deficiency of other specified B group vitamins: Secondary | ICD-10-CM

## 2018-03-21 NOTE — Telephone Encounter (Signed)
Left message for patient to call and schedule an office visit with pcp for refills.

## 2018-03-21 NOTE — Telephone Encounter (Signed)
[name]. NTBS RF 12/14/17

## 2018-03-22 ENCOUNTER — Ambulatory Visit (INDEPENDENT_AMBULATORY_CARE_PROVIDER_SITE_OTHER): Payer: 59 | Admitting: Physician Assistant

## 2018-03-22 ENCOUNTER — Encounter: Payer: Self-pay | Admitting: Physician Assistant

## 2018-03-22 VITALS — BP 143/85 | HR 72 | Temp 98.5°F | Ht 63.0 in | Wt 228.0 lb

## 2018-03-22 DIAGNOSIS — J0111 Acute recurrent frontal sinusitis: Secondary | ICD-10-CM | POA: Diagnosis not present

## 2018-03-22 DIAGNOSIS — E538 Deficiency of other specified B group vitamins: Secondary | ICD-10-CM | POA: Diagnosis not present

## 2018-03-22 DIAGNOSIS — M0579 Rheumatoid arthritis with rheumatoid factor of multiple sites without organ or systems involvement: Secondary | ICD-10-CM

## 2018-03-22 DIAGNOSIS — J301 Allergic rhinitis due to pollen: Secondary | ICD-10-CM | POA: Diagnosis not present

## 2018-03-22 DIAGNOSIS — E039 Hypothyroidism, unspecified: Secondary | ICD-10-CM | POA: Diagnosis not present

## 2018-03-22 DIAGNOSIS — I10 Essential (primary) hypertension: Secondary | ICD-10-CM

## 2018-03-22 MED ORDER — CEFDINIR 300 MG PO CAPS: 300.0000 mg | ORAL_CAPSULE | Freq: Two times a day (BID) | ORAL | 0 refills | Status: DC

## 2018-03-22 MED ORDER — MOMETASONE FURO-FORMOTEROL FUM 100-5 MCG/ACT IN AERO: 2.0000 | INHALATION_SPRAY | Freq: Two times a day (BID) | RESPIRATORY_TRACT | 3 refills | Status: DC

## 2018-03-22 MED ORDER — LORATADINE 10 MG PO TABS: 10.0000 mg | ORAL_TABLET | Freq: Every day | ORAL | 3 refills | Status: DC

## 2018-03-22 MED ORDER — CYANOCOBALAMIN 1000 MCG/ML IJ SOLN: INTRAMUSCULAR | 11 refills | Status: DC

## 2018-03-22 MED ORDER — OLMESARTAN MEDOXOMIL 40 MG PO TABS: 40.0000 mg | ORAL_TABLET | Freq: Every day | ORAL | 3 refills | Status: DC

## 2018-03-22 MED ORDER — LEVOTHYROXINE SODIUM 75 MCG PO TABS: 75.0000 ug | ORAL_TABLET | Freq: Every day | ORAL | 1 refills | Status: DC

## 2018-03-22 MED ORDER — FLUCONAZOLE 150 MG PO TABS: ORAL_TABLET | ORAL | 0 refills | Status: DC

## 2018-03-22 NOTE — Progress Notes (Signed)
BP (!) 143/85   Pulse 72   Temp 98.5 F (36.9 C) (Oral)   Ht _0  (1.6 m)   Wt 228 lb (103.4 kg)   BMI 40.39 kg/m    Subjective:    Patient ID: Hannah Lane, female    DOB: 1966-02-19, 52 y.o.   MRN: [id number]  HPI: Hannah Lane is a 52 y.o. female presenting on 03/22/2018 for Hypertension  This patient comes in for periodic recheck on medications and conditions including allergic rhinitis, B12 deficiency, hypothyroidism, hypertension, rheumatoid arthritis.  All of her medications have been reviewed and labs will be drawn today.  She is not having any other significant problems at this time.  She is having sinus issues going on for quite some time.  This patient has had many days of sinus headache and postnasal drainage. There is copious drainage at times. Denies any fever at this time. There has been a history of sinus infections in the past.  No history of sinus surgery. There is cough at night. It has become more prevalent in recent days.   All medications are reviewed today. There are no reports of any problems with the medications. All of the medical conditions are reviewed and updated.  Lab work is reviewed and will be ordered as medically necessary. There are no new problems reported with today's visit.   Past Medical History:  Diagnosis Date  . Hypertension   . Kidney stones   . Rheumatoid arthritis ([place])   . Thyroid disease    Relevant past medical, surgical, family and social history reviewed and updated as indicated. Interim medical history since our last visit reviewed. Allergies and medications reviewed and updated. DATA REVIEWED: CHART IN EPIC  Family History reviewed for pertinent findings.  Review of Systems  Constitutional: Positive for chills and fatigue. Negative for activity change and appetite change.  HENT: Positive for congestion, postnasal drip and sore throat.   Eyes: Negative.   Respiratory: Positive for cough. Negative for wheezing.     Cardiovascular: Negative.  Negative for chest pain, palpitations and leg swelling.  Gastrointestinal: Negative.   Genitourinary: Negative.   Musculoskeletal: Negative.   Skin: Negative.   Neurological: Positive for headaches.    Allergies as of 03/22/2018      Reactions   Penicillins Hives      Medication List        Accurate as of 03/22/18  6:27 PM. Always use your most recent med list.          2-3CC SYRINGE 3 ML Misc 1 each by Does not apply route every 28 (twenty-eight) days.   albuterol 108 (90 Base) MCG/ACT inhaler Commonly known as:  PROVENTIL HFA;VENTOLIN HFA Inhale 2 puffs into the lungs every 6 (six) hours as needed for wheezing or shortness of breath.   cefdinir 300 MG capsule Commonly known as:  OMNICEF Take 1 capsule (300 mg total) by mouth 2 (two) times daily. 1 po BID   cyanocobalamin 1000 MCG/ML injection Commonly known as:  (VITAMIN B-12) INJECT 1 ML (1000 MCG TOTAL) UNDER THE SKIN EVERY 30 DAYS   fluconazole 150 MG tablet Commonly known as:  DIFLUCAN 1 po q week x 4 weeks   levothyroxine 75 MCG tablet Commonly known as:  SYNTHROID, LEVOTHROID Take 1 tablet (75 mcg total) by mouth daily.   loratadine 10 MG tablet Commonly known as:  CLARITIN Take 1 tablet (10 mg total) by mouth daily.   meloxicam 15 MG tablet Commonly  known as:  MOBIC Take 15 mg by mouth daily.   mometasone-formoterol 100-5 MCG/ACT Aero Commonly known as:  DULERA Inhale 2 puffs into the lungs 2 (two) times daily.   multivitamin tablet Take 1 tablet by mouth daily.   NEEDLE (DISP) 25 G 25G X 1" Misc 1 each by Does not apply route every 28 (twenty-eight) days.   olmesartan 40 MG tablet Commonly known as:  BENICAR Take 1 tablet (40 mg total) by mouth daily.          Objective:    BP (!) 143/85   Pulse 72   Temp 98.5 F (36.9 C) (Oral)   Ht _0  (1.6 m)   Wt 228 lb (103.4 kg)   BMI 40.39 kg/m   Allergies  Allergen Reactions  . Penicillins Hives    Wt  Readings from Last 3 Encounters:  03/22/18 228 lb (103.4 kg)  07/25/17 224 lb (101.6 kg)  03/15/17 226 lb 9.6 oz (102.8 kg)    Physical Exam  Constitutional: She is oriented to person, place, and time. She appears well-developed and well-nourished.  HENT:  Head: Normocephalic and atraumatic.  Right Ear: Tympanic membrane and external ear normal. No middle ear effusion.  Left Ear: Tympanic membrane and external ear normal.  No middle ear effusion.  Nose: Mucosal edema and rhinorrhea present. Right sinus exhibits no maxillary sinus tenderness. Left sinus exhibits no maxillary sinus tenderness.  Mouth/Throat: Uvula is midline. Posterior oropharyngeal erythema present.  Eyes: Pupils are equal, round, and reactive to light. Conjunctivae and EOM are normal. Right eye exhibits no discharge. Left eye exhibits no discharge.  Neck: Normal range of motion.  Cardiovascular: Normal rate, regular rhythm and normal heart sounds.  Pulmonary/Chest: Effort normal and breath sounds normal. No respiratory distress. She has no wheezes.  Abdominal: Soft.  Lymphadenopathy:    She has no cervical adenopathy.  Neurological: She is alert and oriented to person, place, and time.  Skin: Skin is warm and dry.  Psychiatric: She has a normal mood and affect.        Assessment & Plan:   1. Acute recurrent frontal sinusitis - fluconazole (DIFLUCAN) 150 MG tablet; 1 po q week x 4 weeks  Dispense: 4 tablet; Refill: 0 - cefdinir (OMNICEF) 300 MG capsule; Take 1 capsule (300 mg total) by mouth 2 (two) times daily. 1 po BID  Dispense: 20 capsule; Refill: 0  2. Allergic rhinitis Lane to pollen, unspecified seasonality - loratadine (CLARITIN) 10 MG tablet; Take 1 tablet (10 mg total) by mouth daily.  Dispense: 90 tablet; Refill: 3  3. Vitamin B12 deficiency - cyanocobalamin (,VITAMIN B-12,) 1000 MCG/ML injection; INJECT 1 ML (1000 MCG TOTAL) UNDER THE SKIN EVERY 30 DAYS  Dispense: 3 mL; Refill: 11 - BMP8+EGFR - Lipid  panel - TSH  4. Hypothyroidism, unspecified type - levothyroxine (SYNTHROID, LEVOTHROID) 75 MCG tablet; Take 1 tablet (75 mcg total) by mouth daily.  Dispense: 90 tablet; Refill: 1 - BMP8+EGFR - Lipid panel - TSH  5. HTN (hypertension), benign - olmesartan (BENICAR) 40 MG tablet; Take 1 tablet (40 mg total) by mouth daily.  Dispense: 90 tablet; Refill: 3  6. Rheumatoid arthritis involving multiple sites with positive rheumatoid factor ([place]) Follow with rheumatologist  Continue all other maintenance medications as listed above.  Follow up plan: Return in about 6 months (around 09/20/2018).  Educational handout given for Baggs PA-C [facility] [address]  [place], [place] [address] [phone]  03/22/2018, 6:27 PM

## 2018-03-23 LAB — LIPID PANEL
CHOL/HDL RATIO: 3.6 ratio (ref 0.0–4.4)
Cholesterol, Total: 237 mg/dL — ABNORMAL HIGH (ref 100–199)
HDL: 65 mg/dL (ref >39)
LDL CALC: 144 mg/dL — AB (ref 0–99)
Triglycerides: 140 mg/dL (ref 0–149)
VLDL Cholesterol Cal: 28 mg/dL (ref 5–40)

## 2018-03-23 LAB — BMP8+EGFR
BUN / CREAT RATIO: 13 (ref 9–23)
BUN: 10 mg/dL (ref 6–24)
CALCIUM: 9.5 mg/dL (ref 8.7–10.2)
CHLORIDE: 105 mmol/L (ref 96–106)
CO2: 24 mmol/L (ref 20–29)
Creatinine, Ser: 0.79 mg/dL (ref 0.57–1.00)
GFR calc non Af Amer: 86 mL/min/{1.73_m2} (ref >59)
GFR, EST AFRICAN AMERICAN: 99 mL/min/{1.73_m2} (ref >59)
Glucose: 89 mg/dL (ref 65–99)
POTASSIUM: 4.3 mmol/L (ref 3.5–5.2)
Sodium: 144 mmol/L (ref 134–144)

## 2018-03-23 LAB — TSH: TSH: 2.14 u[IU]/mL (ref 0.450–4.500)

## 2018-03-25 ENCOUNTER — Other Ambulatory Visit: Payer: Self-pay | Admitting: Physician Assistant

## 2018-03-25 MED ORDER — ATORVASTATIN CALCIUM 10 MG PO TABS: 10.0000 mg | ORAL_TABLET | Freq: Every day | ORAL | 3 refills | Status: AC

## 2018-03-28 ENCOUNTER — Telehealth: Payer: Self-pay | Admitting: Physician Assistant

## 2018-03-28 MED ORDER — MOMETASONE FURO-FORMOTEROL FUM 100-5 MCG/ACT IN AERO: 2.0000 | INHALATION_SPRAY | Freq: Two times a day (BID) | RESPIRATORY_TRACT | 0 refills | Status: DC

## 2018-03-28 NOTE — Telephone Encounter (Signed)
done

## 2018-04-03 ENCOUNTER — Other Ambulatory Visit: Payer: Self-pay | Admitting: Physician Assistant

## 2018-04-03 ENCOUNTER — Ambulatory Visit: Payer: 59 | Admitting: Family Medicine

## 2018-04-03 ENCOUNTER — Telehealth: Payer: Self-pay | Admitting: *Deleted

## 2018-04-03 DIAGNOSIS — G510 Bell's palsy: Secondary | ICD-10-CM

## 2018-04-03 MED ORDER — VALACYCLOVIR HCL 1 G PO TABS: 1000.0000 mg | ORAL_TABLET | Freq: Two times a day (BID) | ORAL | 0 refills | Status: AC

## 2018-04-03 MED ORDER — PREDNISONE 10 MG (48) PO TBPK: ORAL_TABLET | ORAL | 0 refills | Status: DC

## 2018-04-03 NOTE — Telephone Encounter (Signed)
Pt called in with c/o facial numbness, tongue numbness R side of face Denies dizziness but states she had headache a couple of days ago Pt states she does have slight facial drooping but no deviation when sticking out her tongue Pt does have hx of B[place] Palsy Offered appt for pt but pt states she is unable to come in due to work

## 2018-04-03 NOTE — Telephone Encounter (Signed)
Left message for patient to call.

## 2018-04-03 NOTE — Telephone Encounter (Signed)
No contralateral weakness or neurological changes. Recent sinus infection that was quite severe. BP has been good. Brother with hemiplegic migraines.  Will start valtrex and prednisone. Check back in in a day or so.

## 2018-04-03 NOTE — Telephone Encounter (Signed)
Aware of new scripts . 

## 2018-05-06 ENCOUNTER — Other Ambulatory Visit: Payer: Self-pay | Admitting: Physician Assistant

## 2018-05-06 MED ORDER — DOXYCYCLINE HYCLATE 100 MG PO TABS: 100.0000 mg | ORAL_TABLET | Freq: Two times a day (BID) | ORAL | 2 refills | Status: DC

## 2018-06-26 ENCOUNTER — Other Ambulatory Visit: Payer: Self-pay | Admitting: Obstetrics and Gynecology

## 2018-06-26 DIAGNOSIS — N6489 Other specified disorders of breast: Secondary | ICD-10-CM

## 2018-07-25 ENCOUNTER — Ambulatory Visit: Payer: 59

## 2018-07-25 ENCOUNTER — Other Ambulatory Visit: Payer: Self-pay | Admitting: Obstetrics and Gynecology

## 2018-07-25 ENCOUNTER — Other Ambulatory Visit: Payer: Self-pay

## 2018-07-25 ENCOUNTER — Ambulatory Visit
Admission: RE | Admit: 2018-07-25 | Discharge: 2018-07-25 | Disposition: A | Discharge status: Home / Self Care | Payer: 59 | Source: Ambulatory Visit | Attending: Obstetrics and Gynecology | Admitting: Obstetrics and Gynecology

## 2018-07-25 DIAGNOSIS — N6489 Other specified disorders of breast: Secondary | ICD-10-CM

## 2018-07-27 ENCOUNTER — Other Ambulatory Visit: Payer: Self-pay | Admitting: Physician Assistant

## 2018-08-08 ENCOUNTER — Encounter: Payer: Self-pay | Admitting: Physician Assistant

## 2018-08-08 ENCOUNTER — Telehealth: Payer: Self-pay

## 2018-08-08 DIAGNOSIS — J45901 Unspecified asthma with (acute) exacerbation: Secondary | ICD-10-CM | POA: Insufficient documentation

## 2018-08-08 NOTE — Telephone Encounter (Signed)
I'm doing a prior authorization for [facility] for this patient and I can't find a diagnosis for this medication.  Please advise.

## 2018-08-08 NOTE — Telephone Encounter (Signed)
Asthma exacerbation Create Overview Change Dx Resolve Today  Remus Loffler, PA-C     Details Code: J45.901

## 2018-08-22 DIAGNOSIS — Z79899 Other long term (current) drug therapy: Secondary | ICD-10-CM | POA: Diagnosis not present

## 2018-08-22 DIAGNOSIS — M0600 Rheumatoid arthritis without rheumatoid factor, unspecified site: Secondary | ICD-10-CM | POA: Diagnosis not present

## 2018-09-05 ENCOUNTER — Other Ambulatory Visit: Payer: Self-pay | Admitting: Physician Assistant

## 2018-09-05 DIAGNOSIS — E039 Hypothyroidism, unspecified: Secondary | ICD-10-CM

## 2018-09-06 ENCOUNTER — Telehealth: Payer: Self-pay | Admitting: Physician Assistant

## 2018-09-17 ENCOUNTER — Other Ambulatory Visit: Payer: Self-pay | Admitting: Physician Assistant

## 2018-09-17 NOTE — Telephone Encounter (Signed)
OV 10/07/18

## 2018-09-27 ENCOUNTER — Other Ambulatory Visit: Payer: Self-pay | Admitting: Physician Assistant

## 2018-09-27 DIAGNOSIS — J0111 Acute recurrent frontal sinusitis: Secondary | ICD-10-CM

## 2018-10-07 ENCOUNTER — Other Ambulatory Visit: Payer: Self-pay

## 2018-10-07 ENCOUNTER — Ambulatory Visit (INDEPENDENT_AMBULATORY_CARE_PROVIDER_SITE_OTHER): Payer: 59 | Admitting: Physician Assistant

## 2018-10-07 DIAGNOSIS — I10 Essential (primary) hypertension: Secondary | ICD-10-CM

## 2018-10-07 DIAGNOSIS — E039 Hypothyroidism, unspecified: Secondary | ICD-10-CM

## 2018-10-07 DIAGNOSIS — J4521 Mild intermittent asthma with (acute) exacerbation: Secondary | ICD-10-CM

## 2018-10-07 MED ORDER — MOMETASONE FURO-FORMOTEROL FUM 100-5 MCG/ACT IN AERO: INHALATION_SPRAY | RESPIRATORY_TRACT | 11 refills | Status: AC

## 2018-10-07 MED ORDER — OLMESARTAN MEDOXOMIL-HCTZ 40-25 MG PO TABS: 1.0000 | ORAL_TABLET | Freq: Every day | ORAL | 3 refills | Status: DC

## 2018-10-07 MED ORDER — ALBUTEROL SULFATE HFA 108 (90 BASE) MCG/ACT IN AERS: 2.0000 | INHALATION_SPRAY | Freq: Four times a day (QID) | RESPIRATORY_TRACT | 3 refills | Status: AC | PRN

## 2018-10-07 NOTE — Progress Notes (Signed)
12  8:08  

## 2018-10-09 ENCOUNTER — Encounter: Payer: Self-pay | Admitting: Physician Assistant

## 2018-10-09 NOTE — Progress Notes (Signed)
Telephone visit  Subjective: CC: Recheck chronic conditions PCP: Remus Loffler, PA-C OVZ:CHYI CLARIS BIGBIE is a 53 y.o. female calls for telephone consult today. Patient provides verbal consent for consult held via phone.  Patient is identified with 2 separate identifiers.  At this time the entire area is on COVID-19 social distancing and stay home orders are in place.  Patient is of higher risk and therefore we are performing this by a virtual method.  Location of patient: Home Location of provider: HOME Others present for call: No  This for periodic recheck on the patient's chronic medical conditions which do include hypothyroidism, hypertension, asthma.  She reports that overall she is doing well.  She does need refills on her blood pressure medication.  We will plan for her to come in for labs this summer.  She reports she has had good blood pressure readings while at home.  She still goes to rheumatology for her arthritis.  They have done a little bit of medication changes over the past year.  She also needs to have her thyroid checked.  She is good on her medications right now.  And she does need refills on her asthma medications that do include albuterol and Dulera.   ROS: Per HPI  Allergies  Allergen Reactions  . Penicillins Hives   Past Medical History:  Diagnosis Date  . Hypertension   . Kidney stones   . Rheumatoid arthritis ([facility])   . Thyroid disease     Current Outpatient Medications:  .  albuterol (VENTOLIN HFA) 108 (90 Base) MCG/ACT inhaler, Inhale 2 puffs into the lungs every 6 (six) hours as needed for wheezing or shortness of breath., Disp: 3 Inhaler, Rfl: 3 .  atorvastatin (LIPITOR) 10 MG tablet, Take 1 tablet (10 mg total) by mouth daily., Disp: 90 tablet, Rfl: 3 .  cyanocobalamin (,VITAMIN B-12,) 1000 MCG/ML injection, INJECT 1 ML (1000 MCG TOTAL) UNDER THE SKIN EVERY 30 DAYS, Disp: 3 mL, Rfl: 11 .  fluconazole (DIFLUCAN) 150 MG tablet, TAKE 1 TABLET BY MOUTH  EVERY WEEK FOR 4 WEEKS, Disp: 2 tablet, Rfl: 1 .  levothyroxine (SYNTHROID) 75 MCG tablet, TAKE 1 TABLET DAILY, Disp: 90 tablet, Rfl: 1 .  loratadine (CLARITIN) 10 MG tablet, Take 1 tablet (10 mg total) by mouth daily., Disp: 90 tablet, Rfl: 3 .  meloxicam (MOBIC) 15 MG tablet, Take 15 mg by mouth daily., Disp: , Rfl: 3 .  mometasone-formoterol (DULERA) 100-5 MCG/ACT AERO, TAKE 2 PUFFS BY MOUTH TWICE A DAY, Disp: 13 Inhaler, Rfl: 11 .  Multiple Vitamin (MULTIVITAMIN) tablet, Take 1 tablet by mouth daily., Disp: , Rfl:  .  NEEDLE, DISP, 25 G 25G X 1" MISC, 1 each by Does not apply route every 28 (twenty-eight) days., Disp: 4 each, Rfl: 3 .  olmesartan-hydrochlorothiazide (BENICAR HCT) 40-25 MG tablet, Take 1 tablet by mouth daily., Disp: 90 tablet, Rfl: 3 .  Syringe, Disposable, (2-3CC SYRINGE) 3 ML MISC, 1 each by Does not apply route every 28 (twenty-eight) days., Disp: 4 each, Rfl: 3 .  valACYclovir (VALTREX) 1000 MG tablet, Take 1 tablet (1,000 mg total) by mouth 2 (two) times daily., Disp: 20 tablet, Rfl: 0  Assessment/ Plan: 53 y.o. female   1. HTN (hypertension), benign - olmesartan-hydrochlorothiazide (BENICAR HCT) 40-25 MG tablet; Take 1 tablet by mouth daily.  Dispense: 90 tablet; Refill: 3  2. Hypothyroidism, unspecified type Labs in coming months  3. Mild intermittent asthma with exacerbation - albuterol (VENTOLIN HFA) 108 (  90 Base) MCG/ACT inhaler; Inhale 2 puffs into the lungs every 6 (six) hours as needed for wheezing or shortness of breath.  Dispense: 3 Inhaler; Refill: 3 - mometasone-formoterol (DULERA) 100-5 MCG/ACT AERO; TAKE 2 PUFFS BY MOUTH TWICE A DAY  Dispense: 13 Inhaler; Refill: 11   Start time: 7:57 AM End time: 8:08 AM  Meds ordered this encounter  Medications  . albuterol (VENTOLIN HFA) 108 (90 Base) MCG/ACT inhaler    Sig: Inhale 2 puffs into the lungs every 6 (six) hours as needed for wheezing or shortness of breath.    Dispense:  3 Inhaler    Refill:  3     Order Specific Question:   Supervising Provider    Answer:   Raliegh Ip [[id number]]  . olmesartan-hydrochlorothiazide (BENICAR HCT) 40-25 MG tablet    Sig: Take 1 tablet by mouth daily.    Dispense:  90 tablet    Refill:  3    Order Specific Question:   Supervising Provider    Answer:   Raliegh Ip [[id number]]  . mometasone-formoterol (DULERA) 100-5 MCG/ACT AERO    Sig: TAKE 2 PUFFS BY MOUTH TWICE A DAY    Dispense:  13 Inhaler    Refill:  11    Order Specific Question:   Supervising Provider    Answer:   Raliegh Ip [[id number]]    Prudy Feeler PA-C [facility] [phone]

## 2018-10-15 ENCOUNTER — Telehealth: Payer: Self-pay | Admitting: Physician Assistant

## 2018-10-17 NOTE — Telephone Encounter (Signed)
Confirmed pt is on combination Olmesartan-HCTZ

## 2018-11-04 ENCOUNTER — Other Ambulatory Visit: Payer: Self-pay

## 2018-11-04 ENCOUNTER — Encounter: Payer: Self-pay | Admitting: Physician Assistant

## 2018-11-04 ENCOUNTER — Ambulatory Visit (INDEPENDENT_AMBULATORY_CARE_PROVIDER_SITE_OTHER): Payer: 59 | Admitting: Physician Assistant

## 2018-11-04 DIAGNOSIS — R0602 Shortness of breath: Secondary | ICD-10-CM | POA: Diagnosis not present

## 2018-11-04 DIAGNOSIS — R0789 Other chest pain: Secondary | ICD-10-CM | POA: Diagnosis not present

## 2018-11-04 NOTE — Progress Notes (Signed)
[facility]  Address: [address], [place], [place] [address]  Phone: [phone]     Telephone visit  Subjective: Hannah Lane tightness PCP: Terald Sleeper, PA-C EXH:Hannah Lane is a 53 y.o. female calls for telephone consult today. Patient provides verbal consent for consult held via phone.  Patient is identified with 2 separate identifiers.  At this time the entire area is on COVID-19 social distancing and stay home orders are in place.  Patient is of higher risk and therefore we are performing this by a virtual method.  Location of patient: work Location of provider: HOME Others present for call: no  This patient states that for the past year she has had increasing amount of chest tightness.  It does not matter if she is active, laying down, sitting.  It will happen at all times.  She denies any problems with GERD.  She rarely has to use an over-the-counter antacid.  She states that she has been using her Dulera regularly and is having to use the albuterol up to twice daily.  In the past she has had bronchitis infections that did create a lot of bronchospasm and cough.  She states that this time she is still having a lot of cough, and does use Mucinex on a fairly regular basis.  She also again is using the albuterol whenever she gets very tight.  She denies any fever chills, weight loss.  She is a non-smoker.  She has lived a house with a smoker, but tries to be outside and not around her.     ROS: Per HPI  Allergies  Allergen Reactions  . Penicillins Hives   Past Medical History:  Diagnosis Date  . Hypertension   . Kidney stones   . Rheumatoid arthritis ([place])   . Thyroid disease     Current Outpatient Medications:  .  albuterol (VENTOLIN HFA) 108 (90 Base) MCG/ACT inhaler, Inhale 2 puffs into the lungs every 6 (six) hours as needed for wheezing or shortness of breath., Disp: 3 Inhaler, Rfl: 3 .  atorvastatin (LIPITOR) 10 MG tablet, Take 1 tablet (10 mg  total) by mouth daily., Disp: 90 tablet, Rfl: 3 .  cyanocobalamin (,VITAMIN B-12,) 1000 MCG/ML injection, INJECT 1 ML (1000 MCG TOTAL) UNDER THE SKIN EVERY 30 DAYS, Disp: 3 mL, Rfl: 11 .  fluconazole (DIFLUCAN) 150 MG tablet, TAKE 1 TABLET BY MOUTH EVERY WEEK FOR 4 WEEKS, Disp: 2 tablet, Rfl: 1 .  levothyroxine (SYNTHROID) 75 MCG tablet, TAKE 1 TABLET DAILY, Disp: 90 tablet, Rfl: 1 .  loratadine (CLARITIN) 10 MG tablet, Take 1 tablet (10 mg total) by mouth daily., Disp: 90 tablet, Rfl: 3 .  meloxicam (MOBIC) 15 MG tablet, Take 15 mg by mouth daily., Disp: , Rfl: 3 .  mometasone-formoterol (DULERA) 100-5 MCG/ACT AERO, TAKE 2 PUFFS BY MOUTH TWICE A DAY, Disp: 13 Inhaler, Rfl: 11 .  Multiple Vitamin (MULTIVITAMIN) tablet, Take 1 tablet by mouth daily., Disp: , Rfl:  .  NEEDLE, DISP, 25 G 25G X 1" MISC, 1 each by Does not apply route every 28 (twenty-eight) days., Disp: 4 each, Rfl: 3 .  olmesartan-hydrochlorothiazide (BENICAR HCT) 40-25 MG tablet, Take 1 tablet by mouth daily., Disp: 90 tablet, Rfl: 3 .  Syringe, Disposable, (2-3CC SYRINGE) 3 ML MISC, 1 each by Does not apply route every 28 (twenty-eight) days., Disp: 4 each, Rfl: 3 .  valACYclovir (VALTREX) 1000 MG tablet, Take 1 tablet (1,000 mg total) by mouth 2 (two) times daily.,  Disp: 20 tablet, Rfl: 0  Assessment/ Plan: 53 y.o. female   1. Chest tightness - Ambulatory referral to Pulmonology  2. Shortness of breath - Ambulatory referral to Pulmonology   Continue all other maintenance medications as listed above.  Start time: 9:25 AM End time: 9:37 AM  No orders of the defined types were placed in this encounter.   Prudy Feeler PA-C [facility] [phone]

## 2018-11-07 ENCOUNTER — Telehealth: Payer: Self-pay | Admitting: Physician Assistant

## 2018-11-08 ENCOUNTER — Other Ambulatory Visit: Payer: Self-pay | Admitting: Physician Assistant

## 2018-11-08 DIAGNOSIS — I10 Essential (primary) hypertension: Secondary | ICD-10-CM

## 2018-11-21 ENCOUNTER — Telehealth: Payer: Self-pay | Admitting: Physician Assistant

## 2018-11-21 DIAGNOSIS — I10 Essential (primary) hypertension: Secondary | ICD-10-CM

## 2018-11-21 MED ORDER — SYRINGE 2-3 ML 3 ML MISC: 1.0000 | 3 refills | Status: AC

## 2018-11-21 MED ORDER — "NEEDLE (DISP) 25G X 1"" MISC": 1.0000 | 3 refills | Status: AC

## 2018-11-21 NOTE — Telephone Encounter (Signed)
Syringes & needles sent to [facility]

## 2018-11-22 ENCOUNTER — Encounter: Payer: Self-pay | Admitting: Physician Assistant

## 2019-01-29 ENCOUNTER — Other Ambulatory Visit: Payer: Self-pay

## 2019-01-29 ENCOUNTER — Ambulatory Visit
Admission: RE | Admit: 2019-01-29 | Discharge: 2019-01-29 | Disposition: A | Discharge status: Home / Self Care | Payer: 59 | Source: Ambulatory Visit | Attending: Obstetrics and Gynecology | Admitting: Obstetrics and Gynecology

## 2019-01-29 DIAGNOSIS — N6489 Other specified disorders of breast: Secondary | ICD-10-CM

## 2019-03-04 ENCOUNTER — Other Ambulatory Visit: Payer: Self-pay | Admitting: Physician Assistant

## 2019-03-04 DIAGNOSIS — J301 Allergic rhinitis due to pollen: Secondary | ICD-10-CM

## 2019-03-11 ENCOUNTER — Ambulatory Visit (INDEPENDENT_AMBULATORY_CARE_PROVIDER_SITE_OTHER): Payer: 59 | Admitting: Nurse Practitioner

## 2019-03-11 ENCOUNTER — Encounter: Payer: Self-pay | Admitting: Nurse Practitioner

## 2019-03-11 ENCOUNTER — Telehealth: Payer: Self-pay | Admitting: Physician Assistant

## 2019-03-11 DIAGNOSIS — J0101 Acute recurrent maxillary sinusitis: Secondary | ICD-10-CM

## 2019-03-11 MED ORDER — DOXYCYCLINE HYCLATE 100 MG PO TABS: 100.0000 mg | ORAL_TABLET | Freq: Two times a day (BID) | ORAL | 0 refills | Status: AC

## 2019-03-11 NOTE — Progress Notes (Signed)
Virtual Visit via telephone Note Lane to COVID-19 pandemic this visit was conducted virtually. This visit type was conducted Lane to national recommendations for restrictions regarding the COVID-19 Pandemic (e.g. social distancing, sheltering in place) in an effort to limit this patient's exposure and mitigate transmission in our community. All issues noted in this document were discussed and addressed.  A physical exam was not performed with this format.  I connected with Hannah Lane on 03/11/19 at 11:55 by telephone and verified that I am speaking with the correct person using two identifiers. Hannah Lane is currently located at work and no one is currently with  her during visit. The provider, Mary-Margaret Hassell Done, FNP is located in their office at time of visit.  I discussed the limitations, risks, security and privacy concerns of performing an evaluation and management service by telephone and the availability of in person appointments. I also discussed with the patient that there may be a patient responsible charge related to this service. The patient expressed understanding and agreed to proceed.   History and Present Illness:   Chief Complaint: URI   HPI Patient calls in c/o facial pressure and congestion. Started over a week ago. No better. Ears started hurting yesterday. Has been taking OTC cold meds without relief.   Review of Systems  Constitutional: Negative for chills and fever.  HENT: Positive for congestion, ear pain and sinus pain. Negative for ear discharge and sore throat.   Respiratory: Positive for cough (productive).   Cardiovascular: Negative.   Neurological: Positive for headaches (slight).  Psychiatric/Behavioral: Negative.   All other systems reviewed and are negative.    Observations/Objective: Alert and oriented- answers all questions appropriately No distress Voice hoarce  Assessment and Plan: Hannah Lane in today with chief complaint of URI    1. Acute recurrent maxillary sinusitis *1. Take meds as prescribed 2. Use a cool mist humidifier especially during the winter months and when heat has been humid. 3. Use saline nose sprays frequently 4. Saline irrigations of the nose can be very helpful if done frequently.  * 4X daily for 1 week*  * Use of a nettie pot can be helpful with this. Follow directions with this* 5. Drink plenty of fluids 6. Keep thermostat turn down low 7.For any cough or congestion  Use plain Mucinex- regular strength or max strength is fine   * Children- consult with Pharmacist for dosing 8. For fever or aces or pains- take tylenol or ibuprofen appropriate for age and weight.  * for fevers greater than 101 orally you may alternate ibuprofen and tylenol every  3 hours.   Meds ordered this encounter  Medications  . doxycycline (VIBRA-TABS) 100 MG tablet    Sig: Take 1 tablet (100 mg total) by mouth 2 (two) times daily. 1 po bid    Dispense:  14 tablet    Refill:  0    Order Specific Question:   Supervising Provider    Answer:   Caryl Pina A [[id number]]      Follow Up Instructions: prn    I discussed the assessment and treatment plan with the patient. The patient was provided an opportunity to ask questions and all were answered. The patient agreed with the plan and demonstrated an understanding of the instructions.   The patient was advised to call back or seek an in-person evaluation if the symptoms worsen or if the condition fails to improve as anticipated.  The above assessment and management plan  was discussed with the patient. The patient verbalized understanding of and has agreed to the management plan. Patient is aware to call the clinic if symptoms persist or worsen. Patient is aware when to return to the clinic for a follow-up visit. Patient educated on when it is appropriate to go to the emergency department.   Time call ended:  12:05  I provided 10 minutes of non-face-to-face time  during this encounter.    Mary-Margaret Daphine Deutscher, FNP

## 2019-03-11 NOTE — Telephone Encounter (Signed)
PAtient has already had appt

## 2019-03-28 ENCOUNTER — Other Ambulatory Visit: Payer: Self-pay

## 2019-03-28 ENCOUNTER — Encounter: Payer: Self-pay | Admitting: Family Medicine

## 2019-03-28 ENCOUNTER — Ambulatory Visit (INDEPENDENT_AMBULATORY_CARE_PROVIDER_SITE_OTHER): Payer: 59 | Admitting: Family Medicine

## 2019-03-28 DIAGNOSIS — J0101 Acute recurrent maxillary sinusitis: Secondary | ICD-10-CM | POA: Diagnosis not present

## 2019-03-28 MED ORDER — PREDNISONE 20 MG PO TABS: ORAL_TABLET | ORAL | 0 refills | Status: AC

## 2019-03-28 MED ORDER — CEFDINIR 300 MG PO CAPS: 300.0000 mg | ORAL_CAPSULE | Freq: Two times a day (BID) | ORAL | 0 refills | Status: AC

## 2019-03-28 NOTE — Progress Notes (Signed)
Virtual Visit via telephone Note Due to COVID-19 pandemic this visit was conducted virtually. This visit type was conducted due to national recommendations for restrictions regarding the COVID-19 Pandemic (e.g. social distancing, sheltering in [place]) in an effort to limit this patient's exposure and mitigate transmission in our community. All issues noted in this document were discussed and addressed.  A physical exam was not performed with this format.   I connected with Hannah Lane on 03/28/2019 at 0930 by telephone and verified that I am speaking with the correct person using two identifiers. Hannah Lane is currently located at home and no one is currently with them during visit. The provider, Kari Baars, FNP is located in their office at time of visit.  I discussed the limitations, risks, security and privacy concerns of performing an evaluation and management service by telephone and the availability of in person appointments. I also discussed with the patient that there may be a patient responsible charge related to this service. The patient expressed understanding and agreed to proceed.  Subjective:  Patient ID: Hannah Lane, female    DOB: [birth year], 53 y.o.   MRN: [id number]  Chief Complaint:  Sinus Problem   HPI: Hannah Lane is a 53 y.o. female presenting on 03/28/2019 for Sinus Problem   Pt reports return of sinus pressure, chills, congestion, and pressure in her ears. States she was treated for a sinus infection earlier in the month. States she felt better for a few days after the antibiotics but then symptoms returned and seem to be getting worse. States she has pressure around her eyes and in her ears. States she has a headache. She has been using Flonase and taking her Claritin as prescribed without relief of symptoms.   Sinus Problem This is a recurrent problem. The current episode started 1 to 4 weeks ago. The problem has been gradually worsening since onset. Her pain  is at a severity of 4/10. The pain is mild. Associated symptoms include congestion, coughing, ear pain, headaches and sinus pressure. Pertinent negatives include no chills, diaphoresis, hoarse voice, neck pain, shortness of breath, sneezing, sore throat or swollen glands. Past treatments include spray decongestants and oral decongestants. The treatment provided no relief.     Relevant past medical, surgical, family, and social history reviewed and updated as indicated.  Allergies and medications reviewed and updated.   Past Medical History:  Diagnosis Date  . Hypertension   . Kidney stones   . Rheumatoid arthritis (HCC)   . Thyroid disease     Past Surgical History:  Procedure Laterality Date  . ABDOMINAL HYSTERECTOMY    . CHOLECYSTECTOMY      Social History   Socioeconomic History  . Marital status: Married    Spouse name: Not on file  . Number of children: Not on file  . Years of education: Not on file  . Highest education level: Not on file  Occupational History  . Not on file  Social Needs  . Financial resource strain: Not on file  . Food insecurity    Worry: Not on file    Inability: Not on file  . Transportation needs    Medical: Not on file    Non-medical: Not on file  Tobacco Use  . Smoking status: Never Smoker  . Smokeless tobacco: Never Used  Substance and Sexual Activity  . Alcohol use: No  . Drug use: No  . Sexual activity: Not on file  Lifestyle  . Physical  activity    Days per week: Not on file    Minutes per session: Not on file  . Stress: Not on file  Relationships  . Social Herbalist on phone: Not on file    Gets together: Not on file    Attends religious service: Not on file    Active member of club or organization: Not on file    Attends meetings of clubs or organizations: Not on file    Relationship status: Not on file  . Intimate partner violence    Fear of current or ex partner: Not on file    Emotionally abused: Not on  file    Physically abused: Not on file    Forced sexual activity: Not on file  Other Topics Concern  . Not on file  Social History Narrative  . Not on file    Outpatient Encounter Medications as of 03/28/2019  Medication Sig  . albuterol (VENTOLIN HFA) 108 (90 Base) MCG/ACT inhaler Inhale 2 puffs into the lungs every 6 (six) hours as needed for wheezing or shortness of breath.  Marland Kitchen atorvastatin (LIPITOR) 10 MG tablet Take 1 tablet (10 mg total) by mouth daily.  . cefdinir (OMNICEF) 300 MG capsule Take 1 capsule (300 mg total) by mouth 2 (two) times daily. 1 po BID  . cyanocobalamin (,VITAMIN B-12,) 1000 MCG/ML injection INJECT 1 ML (1000 MCG TOTAL) UNDER THE SKIN EVERY 30 DAYS  . doxycycline (VIBRA-TABS) 100 MG tablet Take 1 tablet (100 mg total) by mouth 2 (two) times daily. 1 po bid  . fluconazole (DIFLUCAN) 150 MG tablet TAKE 1 TABLET BY MOUTH EVERY WEEK FOR 4 WEEKS  . levothyroxine (SYNTHROID) 75 MCG tablet TAKE 1 TABLET DAILY  . loratadine (CLARITIN) 10 MG tablet TAKE 1 TABLET DAILY  . meloxicam (MOBIC) 15 MG tablet Take 15 mg by mouth daily.  . mometasone-formoterol (DULERA) 100-5 MCG/ACT AERO TAKE 2 PUFFS BY MOUTH TWICE A DAY  . Multiple Vitamin (MULTIVITAMIN) tablet Take 1 tablet by mouth daily.  Marland Kitchen NEEDLE, DISP, 25 G 25G X 1" MISC 1 each by Does not apply route every 28 (twenty-eight) days. Dx E53.8  . olmesartan-hydrochlorothiazide (BENICAR HCT) 40-25 MG tablet TAKE 1 TABLET BY MOUTH ONCE DAILY  . predniSONE (DELTASONE) 20 MG tablet 2 po at sametime daily for 5 days  . Syringe, Disposable, (2-3CC SYRINGE) 3 ML MISC 1 each by Does not apply route every 28 (twenty-eight) days. Dx E53.8  . valACYclovir (VALTREX) 1000 MG tablet Take 1 tablet (1,000 mg total) by mouth 2 (two) times daily.   No facility-administered encounter medications on file as of 03/28/2019.     Allergies  Allergen Reactions  . Penicillins Hives    Review of Systems  Constitutional: Positive for fatigue.  Negative for activity change, appetite change, chills, diaphoresis, fever and unexpected weight change.  HENT: Positive for congestion, ear pain, postnasal drip, rhinorrhea, sinus pressure and sinus pain. Negative for hoarse voice, sneezing and sore throat.   Eyes: Negative.  Negative for photophobia and visual disturbance.  Respiratory: Positive for cough. Negative for chest tightness and shortness of breath.   Cardiovascular: Negative for chest pain, palpitations and leg swelling.  Gastrointestinal: Negative for abdominal pain, blood in stool, constipation, diarrhea, nausea and vomiting.  Endocrine: Negative.   Genitourinary: Negative for decreased urine volume, difficulty urinating, dysuria, frequency and urgency.  Musculoskeletal: Negative for arthralgias, myalgias and neck pain.  Skin: Negative.  Negative for color change and rash.  Allergic/Immunologic: Negative.   Neurological: Positive for headaches. Negative for dizziness, weakness and light-headedness.  Hematological: Negative.   Psychiatric/Behavioral: Negative for confusion, hallucinations, sleep disturbance and suicidal ideas.  All other systems reviewed and are negative.        Observations/Objective: No vital signs or physical exam, this was a telephone or virtual health encounter.  Pt alert and oriented, answers all questions appropriately, and able to speak in full sentences.    Assessment and Plan: Yasmeen was seen today for sinus problem.  Diagnoses and all orders for this visit:  Acute recurrent maxillary sinusitis Recurrent sinusitis / double sickening. Will treat with cefdinir as she was recently on doxycycline. Will give burst of steroids. Continue symptomatic care at home. Report any new, worsening, or persistent symptoms.  -     cefdinir (OMNICEF) 300 MG capsule; Take 1 capsule (300 mg total) by mouth 2 (two) times daily. 1 po BID -     predniSONE (DELTASONE) 20 MG tablet; 2 po at sametime daily for 5 days      Follow Up Instructions: Return if symptoms worsen or fail to improve.    I discussed the assessment and treatment plan with the patient. The patient was provided an opportunity to ask questions and all were answered. The patient agreed with the plan and demonstrated an understanding of the instructions.   The patient was advised to call back or seek an in-person evaluation if the symptoms worsen or if the condition fails to improve as anticipated.  The above assessment and management plan was discussed with the patient. The patient verbalized understanding of and has agreed to the management plan. Patient is aware to call the clinic if they develop any new symptoms or if symptoms persist or worsen. Patient is aware when to return to the clinic for a follow-up visit. Patient educated on when it is appropriate to go to the emergency department.    I provided 15 minutes of non-face-to-face time during this encounter. The call started at 0930. The call ended at 0945. The other time was used for coordination of care.    Kari Baars, FNP-C [facility] [address] [place], [place] [address] [phone] 03/28/2019

## 2019-05-10 ENCOUNTER — Other Ambulatory Visit: Payer: Self-pay | Admitting: Physician Assistant

## 2019-05-10 DIAGNOSIS — E039 Hypothyroidism, unspecified: Secondary | ICD-10-CM

## 2019-05-12 ENCOUNTER — Other Ambulatory Visit: Payer: Self-pay | Admitting: Physician Assistant

## 2019-05-12 NOTE — Telephone Encounter (Signed)
Over 1 year since thyroid labs drawn. Please Hannah Lane

## 2019-06-02 ENCOUNTER — Other Ambulatory Visit: Payer: Self-pay | Admitting: Physician Assistant

## 2019-06-02 DIAGNOSIS — E538 Deficiency of other specified B group vitamins: Secondary | ICD-10-CM

## 2019-06-02 NOTE — Telephone Encounter (Signed)
Last b12 09/13/2016

## 2019-06-27 ENCOUNTER — Other Ambulatory Visit: Payer: Self-pay | Admitting: Physician Assistant

## 2019-06-27 DIAGNOSIS — J0111 Acute recurrent frontal sinusitis: Secondary | ICD-10-CM

## 2019-10-20 ENCOUNTER — Other Ambulatory Visit: Payer: Self-pay | Admitting: *Deleted

## 2019-10-20 DIAGNOSIS — I10 Essential (primary) hypertension: Secondary | ICD-10-CM

## 2019-10-20 NOTE — Telephone Encounter (Signed)
Former Yetta Barre. NTBS LOV 10/07/18 other 2  have been acute. Mail order not sent

## 2019-10-20 NOTE — Telephone Encounter (Signed)
Called patient, no answer, left voicemail to return call.  

## 2019-10-28 ENCOUNTER — Other Ambulatory Visit: Payer: Self-pay | Admitting: *Deleted

## 2019-10-28 DIAGNOSIS — I10 Essential (primary) hypertension: Secondary | ICD-10-CM

## 2019-10-28 NOTE — Telephone Encounter (Signed)
Former Yetta Barre. NTBS LOV 10/07/18 mail order not sent

## 2020-02-27 ENCOUNTER — Other Ambulatory Visit: Payer: Self-pay | Admitting: *Deleted

## 2020-02-27 DIAGNOSIS — J301 Allergic rhinitis due to pollen: Secondary | ICD-10-CM

## 2020-02-27 MED ORDER — LORATADINE 10 MG PO TABS: 10.0000 mg | ORAL_TABLET | Freq: Every day | ORAL | 3 refills | Status: AC

## 2020-05-04 ENCOUNTER — Other Ambulatory Visit: Payer: Self-pay | Admitting: *Deleted

## 2020-05-04 DIAGNOSIS — E039 Hypothyroidism, unspecified: Secondary | ICD-10-CM

## 2020-05-17 ENCOUNTER — Other Ambulatory Visit: Payer: Self-pay | Admitting: *Deleted

## 2020-05-17 DIAGNOSIS — E039 Hypothyroidism, unspecified: Secondary | ICD-10-CM

## 2020-06-01 ENCOUNTER — Other Ambulatory Visit: Payer: Self-pay | Admitting: *Deleted

## 2020-06-01 DIAGNOSIS — E538 Deficiency of other specified B group vitamins: Secondary | ICD-10-CM

## 2021-02-21 ENCOUNTER — Other Ambulatory Visit: Payer: Self-pay | Admitting: Family Medicine

## 2021-02-21 DIAGNOSIS — J301 Allergic rhinitis due to pollen: Secondary | ICD-10-CM

## 2021-06-04 IMAGING — MG MM DIGITAL DIAGNOSTIC BILAT W/ TOMO W/ CAD
8 series · 8 of 24 positions shown · non-contrast
Comparison: Previous exam(s).

CLINICAL DATA: Short-term interval follow-up of a probable benign
asymmetry in the left breast originally seen on screening mammogram
dated 12/19/2017.

EXAM:
DIGITAL DIAGNOSTIC BILATERAL MAMMOGRAM WITH CAD AND TOMO

[R MLO synth-2D]
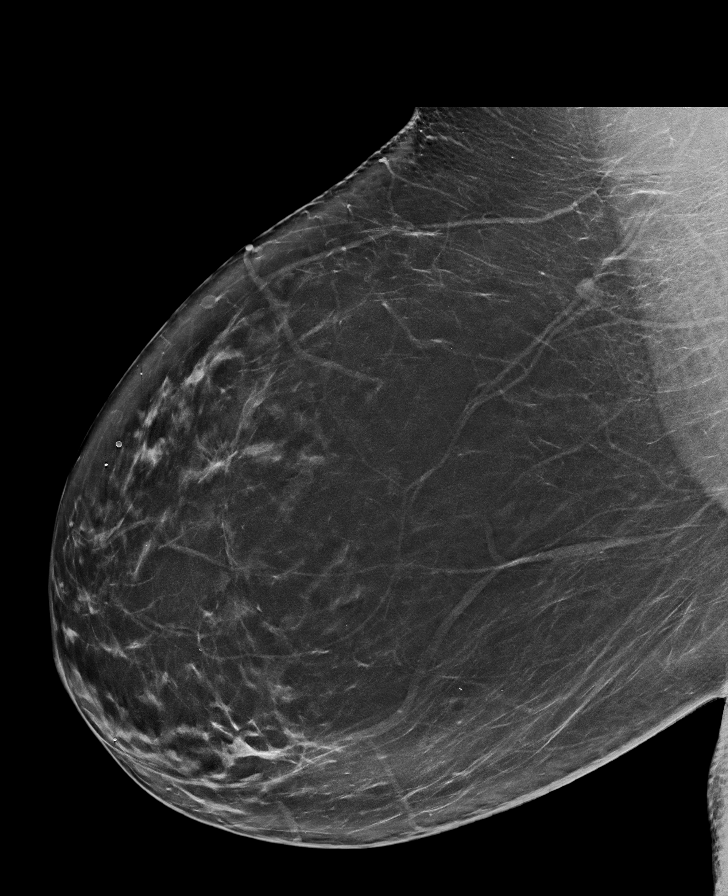

[L MLO synth-2D]
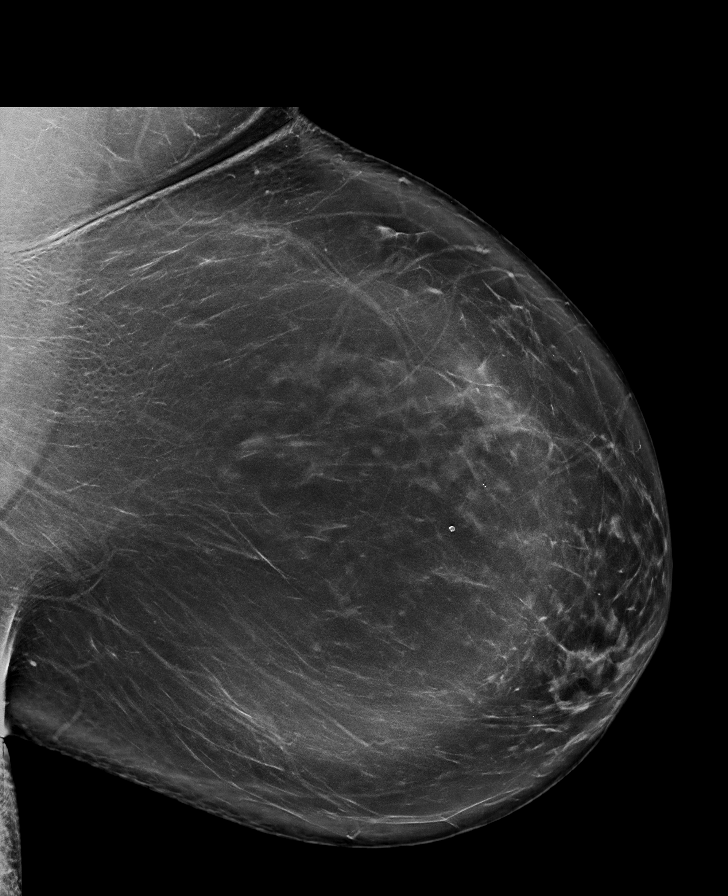

[R CC synth-2D]
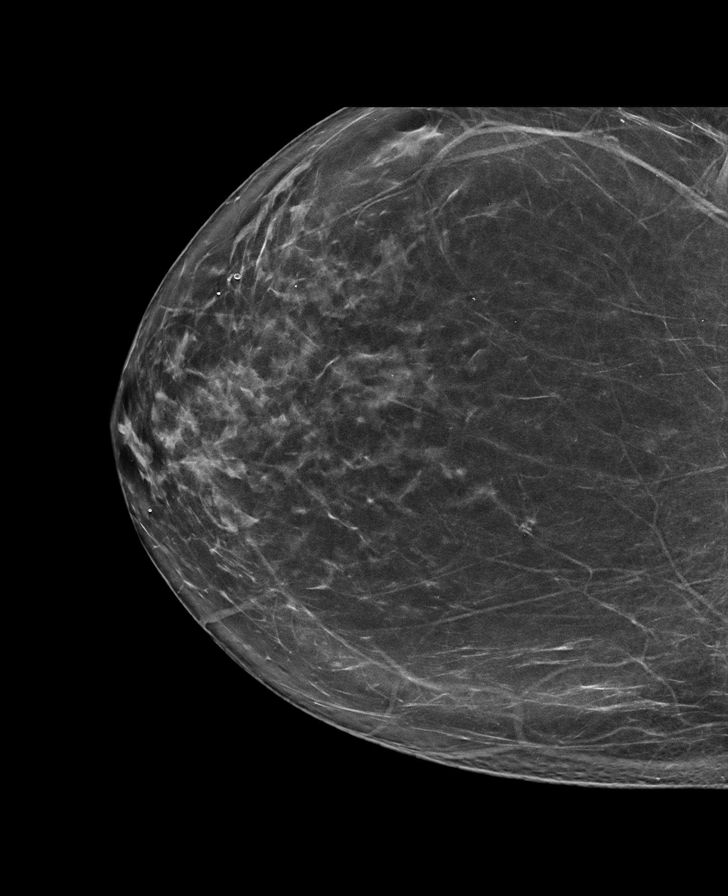

[L CC synth-2D]
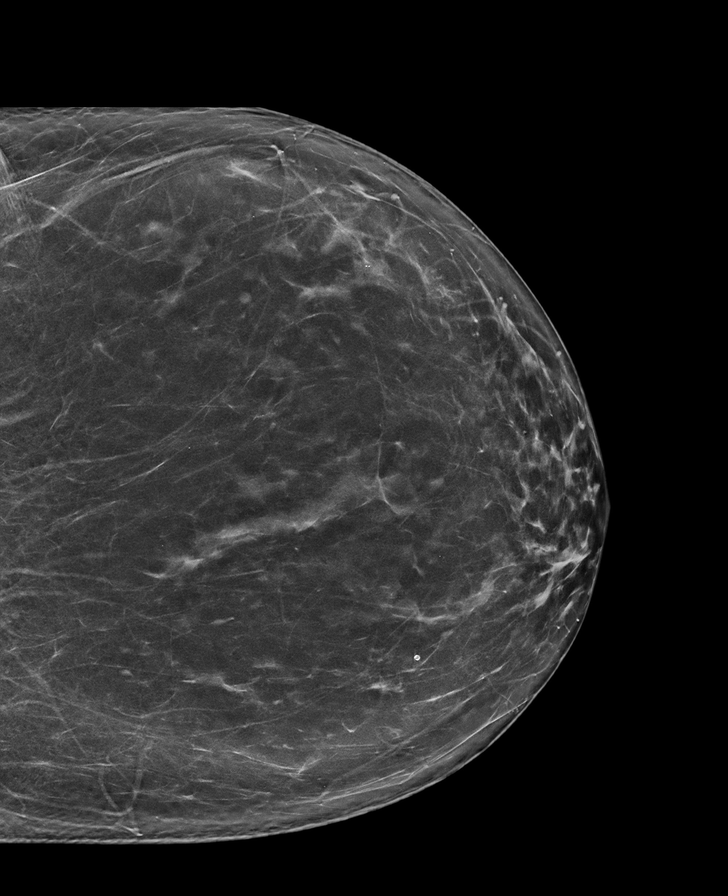

[R CC tomo · tomo slice 41/80.0]
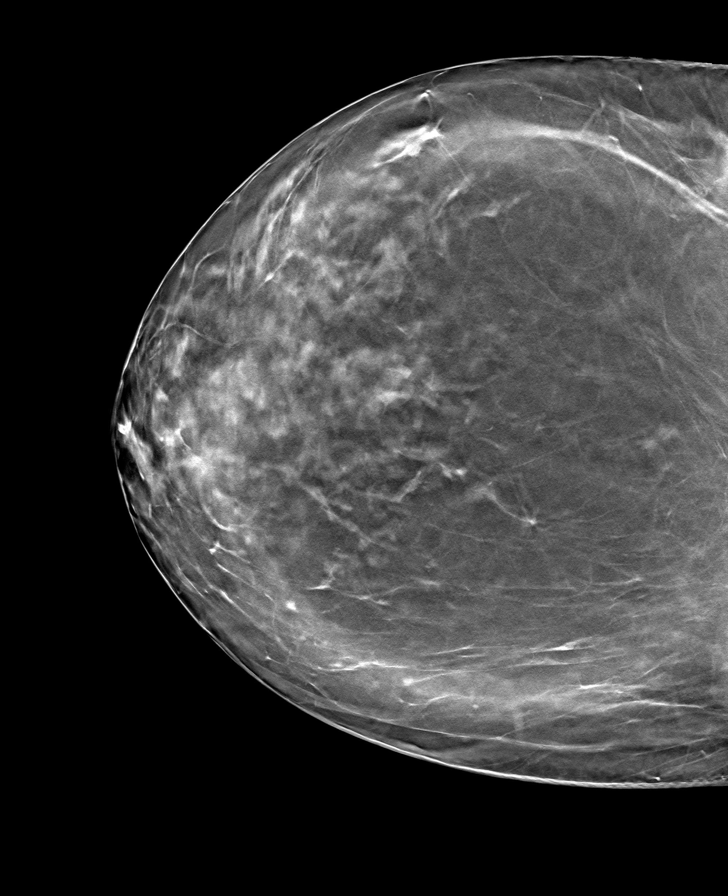

[R MLO tomo · tomo slice 45/90.0]
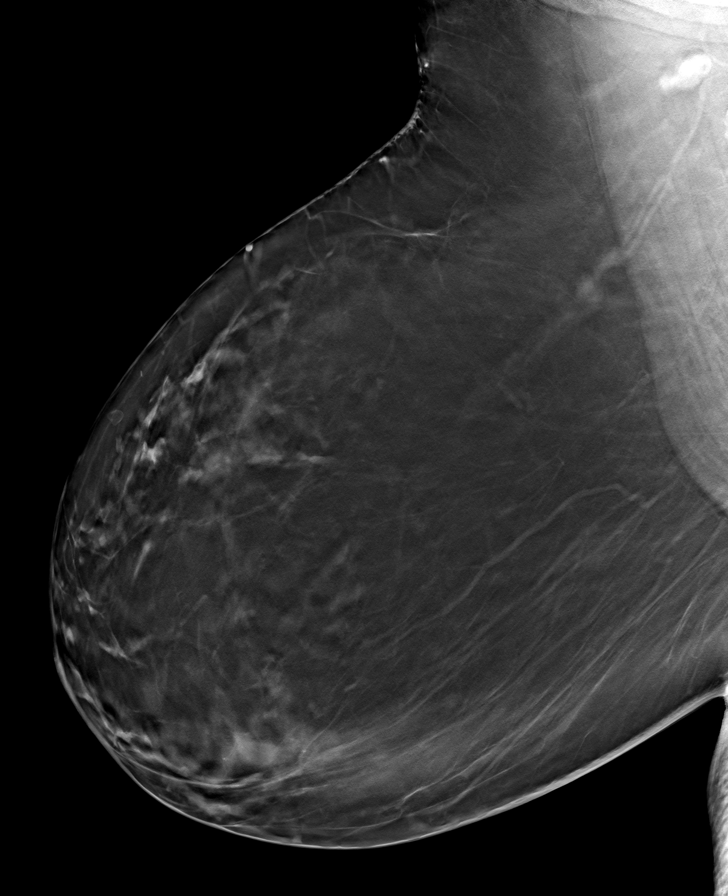

[L CC tomo · tomo slice 41/82.0]
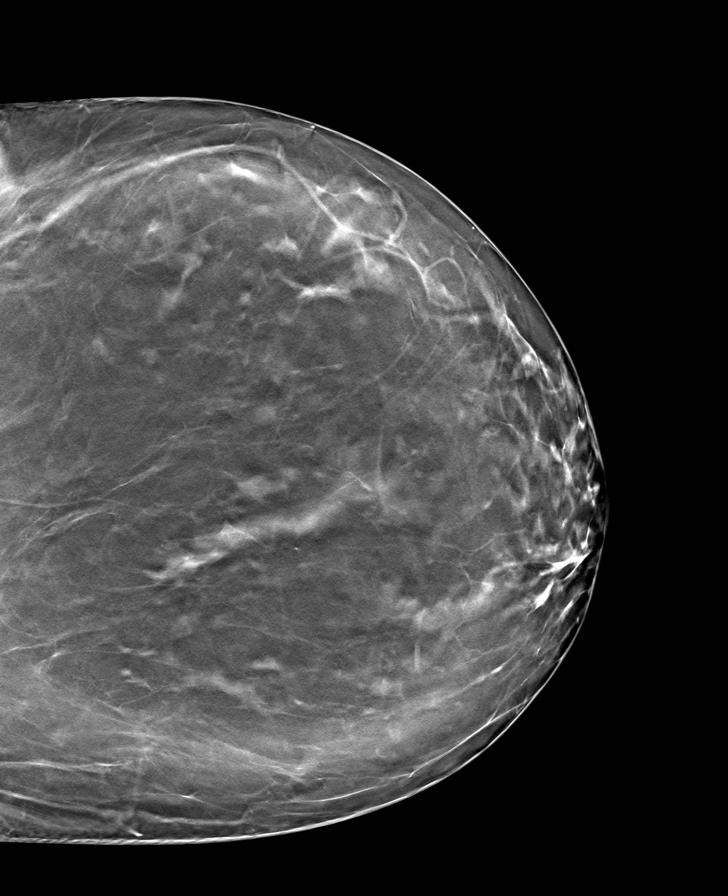

[L MLO tomo · tomo slice 49/98.0]
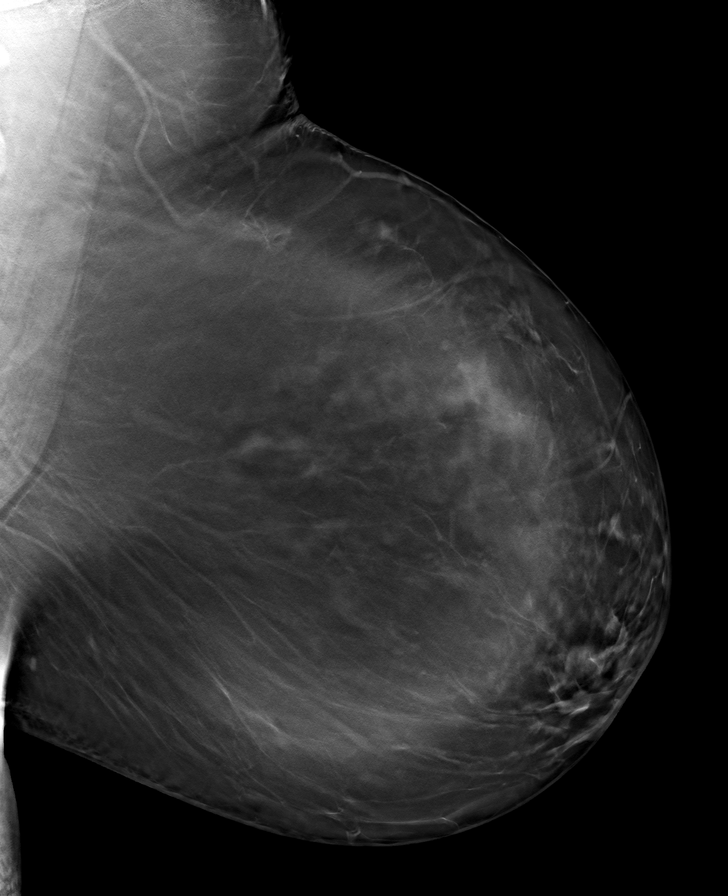

[8 of 24 positions shown; findings below may reference images not displayed]

ACR Breast Density Category b: There are scattered areas of
fibroglandular density.
FINDINGS: Previously seen asymmetry in the left breast is no longer
visualized. No suspicious mass, malignant type microcalcifications
or distortion detected in either breast.

Mammographic images were processed with CAD.
IMPRESSION: No evidence of malignancy in either breast.

RECOMMENDATION:
Bilateral screening mammogram in 1 year is recommended.

I have discussed the findings and recommendations with the patient.
If applicable, a reminder letter will be sent to the patient
regarding the next appointment.

BI-RADS CATEGORY  1: Negative.

## 2022-09-05 ENCOUNTER — Ambulatory Visit: Payer: 59 | Attending: Orthopedic Surgery

## 2022-09-05 ENCOUNTER — Other Ambulatory Visit: Payer: Self-pay

## 2022-09-05 DIAGNOSIS — M25662 Stiffness of left knee, not elsewhere classified: Secondary | ICD-10-CM | POA: Diagnosis present

## 2022-09-05 DIAGNOSIS — M25562 Pain in left knee: Secondary | ICD-10-CM

## 2022-09-05 NOTE — Therapy (Signed)
OUTPATIENT PHYSICAL THERAPY LOWER EXTREMITY EVALUATION   Patient Name: Hannah Lane MRN: [id number] DOB:08/25/1965, 57 y.o., female Today's Date: 09/05/2022  END OF SESSION:  PT End of Session - 09/05/22 1430     Visit Number 1    Number of Visits 12    Date for PT Re-Evaluation 11/03/22    PT Start Time 1433    PT Stop Time 1509    PT Time Calculation (min) 36 min    Activity Tolerance Patient tolerated treatment well    Behavior During Therapy [facility] for tasks assessed/performed             Past Medical History:  Diagnosis Date   Hypertension    Kidney stones    Rheumatoid arthritis (HCC)    Thyroid disease    Past Surgical History:  Procedure Laterality Date   ABDOMINAL HYSTERECTOMY     CHOLECYSTECTOMY     Patient Active Problem List   Diagnosis Date Noted   Asthma exacerbation 08/08/2018   Allergic rhinitis due to pollen 03/22/2018   Weight gain 03/15/2017   BMI 38.0-38.9,adult 03/15/2017   Metabolic syndrome 03/15/2017   Well adult exam 09/15/2016   Class 3 severe obesity due to excess calories with serious comorbidity and body mass index (BMI) of 40.0 to 44.9 in adult (HCC) 09/15/2016   HTN (hypertension), benign 02/08/2016   Rheumatoid arthritis (HCC) 02/08/2016   Hypothyroidism 02/08/2016   Morbid obesity (HCC) 02/08/2016   Vitamin B12 deficiency 02/08/2016   REFERRING PROVIDER: Waylan Boga, MD  REFERRING DIAG: Primary osteoarthritis of left knee  THERAPY DIAG:  Stiffness of left knee, not elsewhere classified  Acute pain of left knee  Rationale for Evaluation and Treatment: Rehabilitation  ONSET DATE: 08/30/22  SUBJECTIVE:   SUBJECTIVE STATEMENT: Patient reports that she had a left knee replacement on 08/30/22. She notes that her knee is been mainly stiff and sore since surgery. She notes that she has had one night which she could not really sleep, but otherwise it has been alright. She had been mainly use a walker until today when she  tried using a single point cane. She has been doing her HEP which include SAQ (really painful) and resting her leg on her ottoman to let gravity pull her knee down.   PERTINENT HISTORY: HTN and RA PAIN:  Are you having pain? Yes: NPRS scale: 5/10 Pain location: left knee Pain description: sore and stiff Aggravating factors: none known Relieving factors: ice and elevation   PRECAUTIONS: None  WEIGHT BEARING RESTRICTIONS: No  FALLS:  Has patient fallen in last 6 months? No  LIVING ENVIRONMENT: Lives with: lives with their spouse Lives in: House/apartment Stairs: Yes: External: 2 steps; none; step to pattern, up with the good and down with the bad Has following equipment at home: Single point cane and Walker - 2 wheeled  OCCUPATION: Data processing manager; primarily a desk job  PLOF: Independent  PATIENT GOALS: improved mobility and be able walk without an assistive device  NEXT MD VISIT: 09/25/22  OBJECTIVE:   PATIENT SURVEYS:  FOTO 44.45  COGNITION: Overall cognitive status: Within functional limits for tasks assessed     SENSATION: Patient reports that she has some numbness in spots around her knee.   PALPATION: TTP: right lateral joint line  LOWER EXTREMITY ROM:  Active ROM Right eval Left eval  Hip flexion    Hip extension    Hip abduction    Hip adduction    Hip internal rotation  Hip external rotation    Knee flexion 126 80/ 81 (PROM)   Knee extension 0 26  Ankle dorsiflexion    Ankle plantarflexion    Ankle inversion    Ankle eversion     (Blank rows = not tested)  LOWER EXTREMITY MMT: not tested due to surgical condition, but unable to perform straight leg raise  GAIT: Assistive device utilized: Single point cane Level of assistance: Modified independence Comments: Left knee flexed in stance phase, absent hip extension bilaterally, decreased stride length, and stance phase   TODAY'S TREATMENT:                                                                                                                               DATE:                                     4/30 EXERCISE LOG  Exercise Repetitions and Resistance Comments  Gastroc stretch  3 x 30 seconds   Hamstring stretch  3 x 30 seconds                Blank cell = exercise not performed today   PATIENT EDUCATION:  Education details: HEP, POC, healing, prognosis, anatomy, and goals for therapy  Person educated: Patient Education method: Explanation Education comprehension: verbalized understanding  HOME EXERCISE PROGRAM: [id number]  ASSESSMENT:  CLINICAL IMPRESSION: Patient is a 57 y.o. female who was seen today for physical therapy evaluation and treatment following a left total knee arthroplasty on 08/30/22. She presented with low to moderate pain severity and irritability with left knee flexion being the most aggravating to her familiar symptoms. She also exhibited significant gait deviations secondary to her reduced left knee mobility. Her HEP was updated and she was able to properly demonstrate these interventions. She reported feeling comfortable performing these interventions at home. Recommend that she continue with skilled physical therapy to address her impairments to return to her prior level of function.   OBJECTIVE IMPAIRMENTS: Abnormal gait, decreased activity tolerance, decreased mobility, difficulty walking, decreased ROM, decreased strength, hypomobility, increased edema, impaired flexibility, impaired sensation, impaired tone, and pain.   ACTIVITY LIMITATIONS: carrying, standing, squatting, stairs, transfers, and locomotion level  PARTICIPATION LIMITATIONS: cleaning, laundry, driving, shopping, community activity, occupation, and yard work  PERSONAL FACTORS: 1-2 comorbidities: HTN and RA  are also affecting patient's functional outcome.   REHAB POTENTIAL: Good  CLINICAL DECISION MAKING: Stable/uncomplicated  EVALUATION COMPLEXITY: Low   GOALS: Goals  reviewed with patient? Yes  SHORT TERM GOALS: Target date: 09/26/22 Patient will be independent with her initial HEP.  Baseline: Goal status: INITIAL  2.  Patient will be able to achieve active left knee extension within 10 degrees of neutral for improved gait mechanics.  Baseline:  Goal status: INITIAL  3.  Patient will be able to demonstrate at least 105 degrees of active left  knee flexion for improved knee mobility.  Baseline:  Goal status: INITIAL  4.  Patient will be able to ambulate at least 80 feet without an assistive device for improved household mobility.  Baseline:  Goal status: INITIAL  LONG TERM GOALS: Target date: 10/17/22  Patient will be independent with her advanced HEP.  Baseline:  Goal status: INITIAL  2.  Patient will be able to navigate at least 3 steps with a reciprocal pattern for improved household mobility.  Baseline:  Goal status: INITIAL  3.  Patient will be able to demonstrate at least 120 degrees of active left knee flexion for improved function navigating stairs.  Baseline:  Goal status: INITIAL  4.  Patient will be able to demonstrate active left knee extension with 5 degrees of neutral for improved gait mechanics.  Baseline:  Goal status: INITIAL  PLAN:  PT FREQUENCY: 2x/week  PT DURATION: 6 weeks  PLANNED INTERVENTIONS: Therapeutic exercises, Therapeutic activity, Neuromuscular re-education, Balance training, Gait training, Patient/Family education, Self Care, Joint mobilization, Stair training, Electrical stimulation, Cryotherapy, Moist heat, Vasopneumatic device, Manual therapy, and Re-evaluation  PLAN FOR NEXT SESSION: Nustep, focus on knee extension, lower extremity strengthening, manual therapy, and modalities as needed   Granville Lewis, PT 09/05/2022, 5:07 PM

## 2022-09-08 ENCOUNTER — Encounter: Payer: Self-pay | Admitting: *Deleted

## 2022-09-08 ENCOUNTER — Ambulatory Visit: Payer: 59 | Attending: Orthopedic Surgery | Admitting: *Deleted

## 2022-09-08 DIAGNOSIS — M25662 Stiffness of left knee, not elsewhere classified: Secondary | ICD-10-CM | POA: Insufficient documentation

## 2022-09-08 DIAGNOSIS — M25562 Pain in left knee: Secondary | ICD-10-CM | POA: Insufficient documentation

## 2022-09-08 NOTE — Therapy (Signed)
OUTPATIENT PHYSICAL THERAPY LOWER EXTREMITY EVALUATION   Patient Name: Hannah Lane MRN: [id number] DOB:[birth year], 57 y.o., female Today's Date: 09/08/2022  END OF SESSION:  PT End of Session - 09/08/22 1009     Visit Number 2    Number of Visits 12    Date for PT Re-Evaluation 11/03/22    PT Start Time 0950    PT Stop Time 1046    PT Time Calculation (min) 56 min             Past Medical History:  Diagnosis Date   Hypertension    Kidney stones    Rheumatoid arthritis (HCC)    Thyroid disease    Past Surgical History:  Procedure Laterality Date   ABDOMINAL HYSTERECTOMY     CHOLECYSTECTOMY     Patient Active Problem List   Diagnosis Date Noted   Asthma exacerbation 08/08/2018   Allergic rhinitis due to pollen 03/22/2018   Weight gain 03/15/2017   BMI 38.0-38.9,adult 03/15/2017   Metabolic syndrome 03/15/2017   Well adult exam 09/15/2016   Class 3 severe obesity due to excess calories with serious comorbidity and body mass index (BMI) of 40.0 to 44.9 in adult (HCC) 09/15/2016   HTN (hypertension), benign 02/08/2016   Rheumatoid arthritis (HCC) 02/08/2016   Hypothyroidism 02/08/2016   Morbid obesity (HCC) 02/08/2016   Vitamin B12 deficiency 02/08/2016   REFERRING PROVIDER: Waylan Boga, MD  REFERRING DIAG: Primary osteoarthritis of left knee  THERAPY DIAG:  Stiffness of left knee, not elsewhere classified  Acute pain of left knee  Rationale for Evaluation and Treatment: Rehabilitation  ONSET DATE: 08/30/22  SUBJECTIVE:   SUBJECTIVE STATEMENT: Patient reports that she had a left knee replacement on 08/30/22. Doing better with LT knee  PERTINENT HISTORY: HTN and RA PAIN:  Are you having pain? Yes: NPRS scale: 5/10 Pain location: left knee Pain description: sore and stiff Aggravating factors: none known Relieving factors: ice and elevation   PRECAUTIONS: None  WEIGHT BEARING RESTRICTIONS: No  FALLS:  Has patient fallen in last 6 months?  No  LIVING ENVIRONMENT: Lives with: lives with their spouse Lives in: House/apartment Stairs: Yes: External: 2 steps; none; step to pattern, up with the good and down with the bad Has following equipment at home: Single point cane and Walker - 2 wheeled  OCCUPATION: Data processing manager; primarily a desk job  PLOF: Independent  PATIENT GOALS: improved mobility and be able walk without an assistive device  NEXT MD VISIT: 09/25/22  OBJECTIVE:   PATIENT SURVEYS:  FOTO 44.45  COGNITION: Overall cognitive status: Within functional limits for tasks assessed     SENSATION: Patient reports that she has some numbness in spots around her knee.   PALPATION: TTP: right lateral joint line  LOWER EXTREMITY ROM:  Active ROM Right eval Left eval  Hip flexion    Hip extension    Hip abduction    Hip adduction    Hip internal rotation    Hip external rotation    Knee flexion 126 80/ 81 (PROM)   Knee extension 0 26  Ankle dorsiflexion    Ankle plantarflexion    Ankle inversion    Ankle eversion     (Blank rows = not tested)  LOWER EXTREMITY MMT: not tested due to surgical condition, but unable to perform straight leg raise  GAIT: Assistive device utilized: Single point cane Level of assistance: Modified independence Comments: Left knee flexed in stance phase, absent hip extension bilaterally, decreased stride length,  and stance phase   TODAY'S TREATMENT:                                                                                                                              DATE:                                     09-08-22 EXERCISE LOG   LT knee     Exercise Repetitions and Resistance Comments  Nustep Seat 9,8 x 15 mins L1    Rocker board X 3 mins PF/DF calf stretching   LAQ  X 20   SAQ  3x10   SLR     Blank cell = exercise not performed today  Manual: Patella Mobs and extension stretching with active QS , STW to posterior aspect Vaso  x 10 mins  on low , LT knee     PATIENT EDUCATION:  Education details: HEP, POC, healing, prognosis, anatomy, and goals for therapy  Person educated: Patient Education method: Explanation Education comprehension: verbalized understanding  HOME EXERCISE PROGRAM: [id number]  ASSESSMENT:  CLINICAL IMPRESSION:  OBJECTIVE IMPAIRMENTS: Abnormal gait, decreased activity tolerance, decreased mobility, difficulty walking, decreased ROM, decreased strength, hypomobility, increased edema, impaired flexibility, impaired sensation, impaired tone, and pain.   ACTIVITY LIMITATIONS: carrying, standing, squatting, stairs, transfers, and locomotion level  PARTICIPATION LIMITATIONS: cleaning, laundry, driving, shopping, community activity, occupation, and yard work  PERSONAL FACTORS: 1-2 comorbidities: HTN and RA  are also affecting patient's functional outcome.   REHAB POTENTIAL: Good  CLINICAL DECISION MAKING: Stable/uncomplicated  EVALUATION COMPLEXITY: Low   GOALS: Goals reviewed with patient? Yes  SHORT TERM GOALS: Target date: 09/26/22 Patient will be independent with her initial HEP.  Baseline: Goal status: INITIAL  2.  Patient will be able to achieve active left knee extension within 10 degrees of neutral for improved gait mechanics.  Baseline:  Goal status: INITIAL  3.  Patient will be able to demonstrate at least 105 degrees of active left knee flexion for improved knee mobility.  Baseline:  Goal status: INITIAL  4.  Patient will be able to ambulate at least 80 feet without an assistive device for improved household mobility.  Baseline:  Goal status: INITIAL  LONG TERM GOALS: Target date: 10/17/22  Patient will be independent with her advanced HEP.  Baseline:  Goal status: INITIAL  2.  Patient will be able to navigate at least 3 steps with a reciprocal pattern for improved household mobility.  Baseline:  Goal status: INITIAL  3.  Patient will be able to demonstrate at least 120 degrees of active  left knee flexion for improved function navigating stairs.  Baseline:  Goal status: INITIAL  4.  Patient will be able to demonstrate active left knee extension with 5 degrees of neutral for improved gait mechanics.  Baseline:  Goal status: INITIAL  PLAN:  PT FREQUENCY: 2x/week  PT DURATION: 6 weeks  PLANNED INTERVENTIONS: Therapeutic exercises, Therapeutic activity, Neuromuscular re-education, Balance training, Gait training, Patient/Family education, Self Care, Joint mobilization, Stair training, Electrical stimulation, Cryotherapy, Moist heat, Vasopneumatic device, Manual therapy, and Re-evaluation  PLAN FOR NEXT SESSION: Nustep, focus on knee extension, lower extremity strengthening, manual therapy, and modalities as needed   Rodd Heft,CHRIS, PTA 09/08/2022, 12:15 PM

## 2022-09-11 ENCOUNTER — Ambulatory Visit: Payer: 59

## 2022-09-11 DIAGNOSIS — M25662 Stiffness of left knee, not elsewhere classified: Secondary | ICD-10-CM

## 2022-09-11 DIAGNOSIS — M25562 Pain in left knee: Secondary | ICD-10-CM

## 2022-09-11 NOTE — Therapy (Signed)
OUTPATIENT PHYSICAL THERAPY LOWER EXTREMITY TREATMENT   Patient Name: Hannah Lane MRN: [id number] DOB:[birth year], 57 y.o., female Today's Date: 09/11/2022  END OF SESSION:  PT End of Session - 09/11/22 1348     Visit Number 3    Number of Visits 12    Date for PT Re-Evaluation 11/03/22    PT Start Time 1345    PT Stop Time 1436    PT Time Calculation (min) 51 min             Past Medical History:  Diagnosis Date   Hypertension    Kidney stones    Rheumatoid arthritis (HCC)    Thyroid disease    Past Surgical History:  Procedure Laterality Date   ABDOMINAL HYSTERECTOMY     CHOLECYSTECTOMY     Patient Active Problem List   Diagnosis Date Noted   Asthma exacerbation 08/08/2018   Allergic rhinitis due to pollen 03/22/2018   Weight gain 03/15/2017   BMI 38.0-38.9,adult 03/15/2017   Metabolic syndrome 03/15/2017   Well adult exam 09/15/2016   Class 3 severe obesity due to excess calories with serious comorbidity and body mass index (BMI) of 40.0 to 44.9 in adult (HCC) 09/15/2016   HTN (hypertension), benign 02/08/2016   Rheumatoid arthritis (HCC) 02/08/2016   Hypothyroidism 02/08/2016   Morbid obesity (HCC) 02/08/2016   Vitamin B12 deficiency 02/08/2016   REFERRING PROVIDER: Waylan Boga, MD  REFERRING DIAG: Primary osteoarthritis of left knee  THERAPY DIAG:  Stiffness of left knee, not elsewhere classified  Acute pain of left knee  Rationale for Evaluation and Treatment: Rehabilitation  ONSET DATE: 08/30/22  SUBJECTIVE:   SUBJECTIVE STATEMENT: Pt denies any pain today, but does report some stiffness.  PERTINENT HISTORY: HTN and RA PAIN:  Are you having pain? No  PRECAUTIONS: None  WEIGHT BEARING RESTRICTIONS: No  FALLS:  Has patient fallen in last 6 months? No  LIVING ENVIRONMENT: Lives with: lives with their spouse Lives in: House/apartment Stairs: Yes: External: 2 steps; none; step to pattern, up with the good and down with the  bad Has following equipment at home: Single point cane and Walker - 2 wheeled  OCCUPATION: Data processing manager; primarily a desk job  PLOF: Independent  PATIENT GOALS: improved mobility and be able walk without an assistive device  NEXT MD VISIT: 09/25/22  OBJECTIVE:   PATIENT SURVEYS:  FOTO 44.45  COGNITION: Overall cognitive status: Within functional limits for tasks assessed     SENSATION: Patient reports that she has some numbness in spots around her knee.   PALPATION: TTP: right lateral joint line  LOWER EXTREMITY ROM:  Active ROM Right eval Left eval  Hip flexion    Hip extension    Hip abduction    Hip adduction    Hip internal rotation    Hip external rotation    Knee flexion 126 80/ 81 (PROM)   Knee extension 0 26  Ankle dorsiflexion    Ankle plantarflexion    Ankle inversion    Ankle eversion     (Blank rows = not tested)  LOWER EXTREMITY MMT: not tested due to surgical condition, but unable to perform straight leg raise  GAIT: Assistive device utilized: Single point cane Level of assistance: Modified independence Comments: Left knee flexed in stance phase, absent hip extension bilaterally, decreased stride length, and stance phase   TODAY'S TREATMENT:  DATE:                                     09-11-22 EXERCISE LOG   LT knee     Exercise Repetitions and Resistance Comments  Nustep Seat 7, 6 x 15 mins L3   Heel/Toe Raises x20 reps each   Rocker board x3 mins PF/DF calf stretching   LAQ x25 reps   Seated marches x25 reps   Clams Red x 25 reps   Ball Squeezes x25 reps   Heel Slides x25 reps   SLR     Blank cell = exercise not performed today   Modalities  Date:  Vaso: Knee, 34 degrees, 15 mins, Pain and Edema   PATIENT EDUCATION:  Education details: HEP, POC, healing, prognosis, anatomy, and goals for therapy  Person  educated: Patient Education method: Explanation Education comprehension: verbalized understanding  HOME EXERCISE PROGRAM: [id number]  ASSESSMENT:  CLINICAL IMPRESSION: Pt arrives for today's treatment session denying any pain, but does report stiffness.  Pt able to tolerate progression of Nustep seat to 6 today with minimal discomfort.  Pt introduced to standing and seated LE exercises today to increased strength, function, and ROM.  Pt requiring min cues for proper technique and posture with all newly added exercises.  Normal responses to vaso noted upon removal.  Pt denied any pain at completion of today's treatment session.  OBJECTIVE IMPAIRMENTS: Abnormal gait, decreased activity tolerance, decreased mobility, difficulty walking, decreased ROM, decreased strength, hypomobility, increased edema, impaired flexibility, impaired sensation, impaired tone, and pain.   ACTIVITY LIMITATIONS: carrying, standing, squatting, stairs, transfers, and locomotion level  PARTICIPATION LIMITATIONS: cleaning, laundry, driving, shopping, community activity, occupation, and yard work  PERSONAL FACTORS: 1-2 comorbidities: HTN and RA  are also affecting patient's functional outcome.   REHAB POTENTIAL: Good  CLINICAL DECISION MAKING: Stable/uncomplicated  EVALUATION COMPLEXITY: Low   GOALS: Goals reviewed with patient? Yes  SHORT TERM GOALS: Target date: 09/26/22 Patient will be independent with her initial HEP.  Baseline: Goal status: INITIAL  2.  Patient will be able to achieve active left knee extension within 10 degrees of neutral for improved gait mechanics.  Baseline:  Goal status: INITIAL  3.  Patient will be able to demonstrate at least 105 degrees of active left knee flexion for improved knee mobility.  Baseline:  Goal status: INITIAL  4.  Patient will be able to ambulate at least 80 feet without an assistive device for improved household mobility.  Baseline:  Goal status:  INITIAL  LONG TERM GOALS: Target date: 10/17/22  Patient will be independent with her advanced HEP.  Baseline:  Goal status: INITIAL  2.  Patient will be able to navigate at least 3 steps with a reciprocal pattern for improved household mobility.  Baseline:  Goal status: INITIAL  3.  Patient will be able to demonstrate at least 120 degrees of active left knee flexion for improved function navigating stairs.  Baseline:  Goal status: INITIAL  4.  Patient will be able to demonstrate active left knee extension with 5 degrees of neutral for improved gait mechanics.  Baseline:  Goal status: INITIAL  PLAN:  PT FREQUENCY: 2x/week  PT DURATION: 6 weeks  PLANNED INTERVENTIONS: Therapeutic exercises, Therapeutic activity, Neuromuscular re-education, Balance training, Gait training, Patient/Family education, Self Care, Joint mobilization, Stair training, Electrical stimulation, Cryotherapy, Moist heat, Vasopneumatic device, Manual therapy, and Re-evaluation  PLAN FOR NEXT SESSION:  Nustep, focus on knee extension, lower extremity strengthening, manual therapy, and modalities as needed   Newman Pies, PTA 09/11/2022, 3:28 PM

## 2022-09-19 ENCOUNTER — Ambulatory Visit: Payer: 59

## 2022-09-19 DIAGNOSIS — M25662 Stiffness of left knee, not elsewhere classified: Secondary | ICD-10-CM

## 2022-09-19 DIAGNOSIS — M25562 Pain in left knee: Secondary | ICD-10-CM

## 2022-09-19 NOTE — Therapy (Signed)
OUTPATIENT PHYSICAL THERAPY LOWER EXTREMITY TREATMENT   Patient Name: Hannah Lane MRN: [id number] DOB:1965-07-08, 57 y.o., female Today's Date: 09/19/2022  END OF SESSION:  PT End of Session - 09/19/22 1041     Visit Number 4    Number of Visits 12    Date for PT Re-Evaluation 11/03/22    PT Start Time 1039    PT Stop Time 1134    PT Time Calculation (min) 55 min             Past Medical History:  Diagnosis Date   Hypertension    Kidney stones    Rheumatoid arthritis (HCC)    Thyroid disease    Past Surgical History:  Procedure Laterality Date   ABDOMINAL HYSTERECTOMY     CHOLECYSTECTOMY     Patient Active Problem List   Diagnosis Date Noted   Asthma exacerbation 08/08/2018   Allergic rhinitis due to pollen 03/22/2018   Weight gain 03/15/2017   BMI 38.0-38.9,adult 03/15/2017   Metabolic syndrome 03/15/2017   Well adult exam 09/15/2016   Class 3 severe obesity due to excess calories with serious comorbidity and body mass index (BMI) of 40.0 to 44.9 in adult (HCC) 09/15/2016   HTN (hypertension), benign 02/08/2016   Rheumatoid arthritis (HCC) 02/08/2016   Hypothyroidism 02/08/2016   Morbid obesity (HCC) 02/08/2016   Vitamin B12 deficiency 02/08/2016   REFERRING PROVIDER: Waylan Boga, MD  REFERRING DIAG: Primary osteoarthritis of left knee  THERAPY DIAG:  Stiffness of left knee, not elsewhere classified  Acute pain of left knee  Rationale for Evaluation and Treatment: Rehabilitation  ONSET DATE: 08/30/22  SUBJECTIVE:   SUBJECTIVE STATEMENT: Pt denies any pain today, but does report minimal stiffness.  PERTINENT HISTORY: HTN and RA PAIN:  Are you having pain? No  PRECAUTIONS: None  WEIGHT BEARING RESTRICTIONS: No  FALLS:  Has patient fallen in last 6 months? No  LIVING ENVIRONMENT: Lives with: lives with their spouse Lives in: House/apartment Stairs: Yes: External: 2 steps; none; step to pattern, up with the good and down with the  bad Has following equipment at home: Single point cane and Walker - 2 wheeled  OCCUPATION: Data processing manager; primarily a desk job  PLOF: Independent  PATIENT GOALS: improved mobility and be able walk without an assistive device  NEXT MD VISIT: 09/25/22  OBJECTIVE:   PATIENT SURVEYS:  FOTO 44.45  COGNITION: Overall cognitive status: Within functional limits for tasks assessed     SENSATION: Patient reports that she has some numbness in spots around her knee.   PALPATION: TTP: right lateral joint line  LOWER EXTREMITY ROM:  Active ROM Right eval Left eval  Hip flexion    Hip extension    Hip abduction    Hip adduction    Hip internal rotation    Hip external rotation    Knee flexion 126 80/ 81 (PROM)   Knee extension 0 26  Ankle dorsiflexion    Ankle plantarflexion    Ankle inversion    Ankle eversion     (Blank rows = not tested)  LOWER EXTREMITY MMT: not tested due to surgical condition, but unable to perform straight leg raise  GAIT: Assistive device utilized: Single point cane Level of assistance: Modified independence Comments: Left knee flexed in stance phase, absent hip extension bilaterally, decreased stride length, and stance phase   TODAY'S TREATMENT:  DATE:                                     09-19-22 EXERCISE LOG   LT knee     Exercise Repetitions and Resistance Comments  Nustep Seat 7-5 x 15 mins L3   Lunges 8" box x 2 mins   Forward Step Ups 6" box x 20 reps    Heel/Toe Raises x20 reps each   Rocker board x3 mins PF/DF calf stretching   LAQ 3# x25 reps   Seated marches 3# x25 reps   Clams    Ball Squeezes    Heel Slides    SLR     Blank cell = exercise not performed today   Modalities  Date:  Vaso: Knee, 34 degrees, 15 mins, Pain and Edema   PATIENT EDUCATION:  Education details: HEP, POC, healing, prognosis,  anatomy, and goals for therapy  Person educated: Patient Education method: Explanation Education comprehension: verbalized understanding  HOME EXERCISE PROGRAM: [id number]  ASSESSMENT:  CLINICAL IMPRESSION: Pt arrives for today's treatment session denying any pain.  Pt able to progress to seat five today on the Nustep with minimal discomfort/stretch reports in left quad region.  Pt introduced to forward lunges and step ups today with min cues for sequencing and hold time on lunges.  Pt able to tolerate the addition of three pound weights with seated exercises today without issue.  Min cues to avoid leaning backward with each rep.  Normal responses to vaso noted upon removal.  Pt denied any pain at the completion of today's treatment session.  OBJECTIVE IMPAIRMENTS: Abnormal gait, decreased activity tolerance, decreased mobility, difficulty walking, decreased ROM, decreased strength, hypomobility, increased edema, impaired flexibility, impaired sensation, impaired tone, and pain.   ACTIVITY LIMITATIONS: carrying, standing, squatting, stairs, transfers, and locomotion level  PARTICIPATION LIMITATIONS: cleaning, laundry, driving, shopping, community activity, occupation, and yard work  PERSONAL FACTORS: 1-2 comorbidities: HTN and RA  are also affecting patient's functional outcome.   REHAB POTENTIAL: Good  CLINICAL DECISION MAKING: Stable/uncomplicated  EVALUATION COMPLEXITY: Low   GOALS: Goals reviewed with patient? Yes  SHORT TERM GOALS: Target date: 09/26/22 Patient will be independent with her initial HEP.  Baseline: Goal status: INITIAL  2.  Patient will be able to achieve active left knee extension within 10 degrees of neutral for improved gait mechanics.  Baseline:  Goal status: INITIAL  3.  Patient will be able to demonstrate at least 105 degrees of active left knee flexion for improved knee mobility.  Baseline:  Goal status: INITIAL  4.  Patient will be able to ambulate  at least 80 feet without an assistive device for improved household mobility.  Baseline:  Goal status: INITIAL  LONG TERM GOALS: Target date: 10/17/22  Patient will be independent with her advanced HEP.  Baseline:  Goal status: INITIAL  2.  Patient will be able to navigate at least 3 steps with a reciprocal pattern for improved household mobility.  Baseline:  Goal status: INITIAL  3.  Patient will be able to demonstrate at least 120 degrees of active left knee flexion for improved function navigating stairs.  Baseline:  Goal status: INITIAL  4.  Patient will be able to demonstrate active left knee extension with 5 degrees of neutral for improved gait mechanics.  Baseline:  Goal status: INITIAL  PLAN:  PT FREQUENCY: 2x/week  PT DURATION: 6 weeks  PLANNED INTERVENTIONS: Therapeutic  exercises, Therapeutic activity, Neuromuscular re-education, Balance training, Gait training, Patient/Family education, Self Care, Joint mobilization, Stair training, Electrical stimulation, Cryotherapy, Moist heat, Vasopneumatic device, Manual therapy, and Re-evaluation  PLAN FOR NEXT SESSION: Nustep, focus on knee extension, lower extremity strengthening, manual therapy, and modalities as needed   Newman Pies, PTA 09/19/2022, 11:59 AM

## 2022-09-21 ENCOUNTER — Ambulatory Visit: Payer: 59

## 2022-09-21 DIAGNOSIS — M25662 Stiffness of left knee, not elsewhere classified: Secondary | ICD-10-CM | POA: Diagnosis not present

## 2022-09-21 DIAGNOSIS — M25562 Pain in left knee: Secondary | ICD-10-CM

## 2022-09-21 NOTE — Therapy (Signed)
OUTPATIENT PHYSICAL THERAPY LOWER EXTREMITY TREATMENT   Patient Name: Hannah Lane MRN: [id number] DOB:January 04, 1966, 57 y.o., female Today's Date: 09/21/2022  END OF SESSION:  PT End of Session - 09/21/22 1035     Visit Number 5    Number of Visits 12    Date for PT Re-Evaluation 11/03/22    PT Start Time 1030    PT Stop Time 1128    PT Time Calculation (min) 58 min             Past Medical History:  Diagnosis Date   Hypertension    Kidney stones    Rheumatoid arthritis (HCC)    Thyroid disease    Past Surgical History:  Procedure Laterality Date   ABDOMINAL HYSTERECTOMY     CHOLECYSTECTOMY     Patient Active Problem List   Diagnosis Date Noted   Asthma exacerbation 08/08/2018   Allergic rhinitis due to pollen 03/22/2018   Weight gain 03/15/2017   BMI 38.0-38.9,adult 03/15/2017   Metabolic syndrome 03/15/2017   Well adult exam 09/15/2016   Class 3 severe obesity due to excess calories with serious comorbidity and body mass index (BMI) of 40.0 to 44.9 in adult (HCC) 09/15/2016   HTN (hypertension), benign 02/08/2016   Rheumatoid arthritis (HCC) 02/08/2016   Hypothyroidism 02/08/2016   Morbid obesity (HCC) 02/08/2016   Vitamin B12 deficiency 02/08/2016   REFERRING PROVIDER: Waylan Boga, MD  REFERRING DIAG: Primary osteoarthritis of left knee  THERAPY DIAG:  Stiffness of left knee, not elsewhere classified  Acute pain of left knee  Rationale for Evaluation and Treatment: Rehabilitation  ONSET DATE: 08/30/22  SUBJECTIVE:   SUBJECTIVE STATEMENT: Pt denies any pain today.    PERTINENT HISTORY: HTN and RA PAIN:  Are you having pain? No  PRECAUTIONS: None  WEIGHT BEARING RESTRICTIONS: No  FALLS:  Has patient fallen in last 6 months? No  LIVING ENVIRONMENT: Lives with: lives with their spouse Lives in: House/apartment Stairs: Yes: External: 2 steps; none; step to pattern, up with the good and down with the bad Has following equipment at  home: Single point cane and Walker - 2 wheeled  OCCUPATION: Data processing manager; primarily a desk job  PLOF: Independent  PATIENT GOALS: improved mobility and be able walk without an assistive device  NEXT MD VISIT: 09/25/22  OBJECTIVE:   PATIENT SURVEYS:  FOTO 44.45  COGNITION: Overall cognitive status: Within functional limits for tasks assessed     SENSATION: Patient reports that she has some numbness in spots around her knee.   PALPATION: TTP: right lateral joint line  LOWER EXTREMITY ROM:  Active ROM Right eval Left eval  Hip flexion    Hip extension    Hip abduction    Hip adduction    Hip internal rotation    Hip external rotation    Knee flexion 126 80/ 81 (PROM)   Knee extension 0 26  Ankle dorsiflexion    Ankle plantarflexion    Ankle inversion    Ankle eversion     (Blank rows = not tested)  LOWER EXTREMITY MMT: not tested due to surgical condition, but unable to perform straight leg raise  GAIT: Assistive device utilized: Single point cane Level of assistance: Modified independence Comments: Left knee flexed in stance phase, absent hip extension bilaterally, decreased stride length, and stance phase   TODAY'S TREATMENT:  DATE:                                     09-21-22 EXERCISE LOG   LT knee     Exercise Repetitions and Resistance Comments  Nustep Seat 6-5 x 15 mins L3   Lunges 10" box x 3 mins   Forward Step Ups 6" box x 20 reps    Lateral Step Ups 6" box x 20 reps   Heel/Toe Raises x20 reps each   Rocker board x3 mins PF/DF calf stretching   LAQ 3# x30 reps   Seated marches 3# x30 reps   Clams    Ball Squeezes    Heel Slides    SLR     Blank cell = exercise not performed today   Modalities  Date:  Vaso: Knee, 34 degrees, 15 mins, Pain and Edema   PATIENT EDUCATION:  Education details: HEP, POC, healing,  prognosis, anatomy, and goals for therapy  Person educated: Patient Education method: Explanation Education comprehension: verbalized understanding  HOME EXERCISE PROGRAM: [id number]  ASSESSMENT:  CLINICAL IMPRESSION: Pt arrives for today's treatment session denying any pain.  Pt able to increase FOTO score to 69 today.  Pt able to demonstrate -4 degrees of left knee extension and 105 degrees of active left knee flexion.  Pt also able to demonstrate a reciprocal gait patter ascending and descending four steps.  Pt is making very good progress towards all of her goals at this time.  Pt encouraged to concentrate on extension and flexion stretches at home.  Normal responses to vaso noted upon removal.  Pt denied any pain at completion of today's treatment session.  OBJECTIVE IMPAIRMENTS: Abnormal gait, decreased activity tolerance, decreased mobility, difficulty walking, decreased ROM, decreased strength, hypomobility, increased edema, impaired flexibility, impaired sensation, impaired tone, and pain.   ACTIVITY LIMITATIONS: carrying, standing, squatting, stairs, transfers, and locomotion level  PARTICIPATION LIMITATIONS: cleaning, laundry, driving, shopping, community activity, occupation, and yard work  PERSONAL FACTORS: 1-2 comorbidities: HTN and RA  are also affecting patient's functional outcome.   REHAB POTENTIAL: Good  CLINICAL DECISION MAKING: Stable/uncomplicated  EVALUATION COMPLEXITY: Low   GOALS: Goals reviewed with patient? Yes  SHORT TERM GOALS: Target date: 09/26/22 Patient will be independent with her initial HEP.  Baseline: Goal status: MET  2.  Patient will be able to achieve active left knee extension within 10 degrees of neutral for improved gait mechanics.  Baseline:  Goal status: MET  3.  Patient will be able to demonstrate at least 105 degrees of active left knee flexion for improved knee mobility.  Baseline:  Goal status: MET  4.  Patient will be able to  ambulate at least 80 feet without an assistive device for improved household mobility.  Baseline:  Goal status: MET  LONG TERM GOALS: Target date: 10/17/22  Patient will be independent with her advanced HEP.  Baseline:  Goal status: IN PROGRESS   2.  Patient will be able to navigate at least 3 steps with a reciprocal pattern for improved household mobility.  Baseline:  Goal status: MET  3.  Patient will be able to demonstrate at least 120 degrees of active left knee flexion for improved function navigating stairs.  Baseline: 5/16: 105 degrees Goal status: IN PROGRESS  4.  Patient will be able to demonstrate active left knee extension with 5 degrees of neutral for improved gait mechanics.  Baseline: 5/16: -  4 degrees Goal status: MET  PLAN:  PT FREQUENCY: 2x/week  PT DURATION: 6 weeks  PLANNED INTERVENTIONS: Therapeutic exercises, Therapeutic activity, Neuromuscular re-education, Balance training, Gait training, Patient/Family education, Self Care, Joint mobilization, Stair training, Electrical stimulation, Cryotherapy, Moist heat, Vasopneumatic device, Manual therapy, and Re-evaluation  PLAN FOR NEXT SESSION: Nustep, focus on knee extension, lower extremity strengthening, manual therapy, and modalities as needed   Newman Pies, PTA 09/21/2022, 11:28 AM

## 2022-09-25 NOTE — Telephone Encounter (Signed)
Erroneous encounter will close.

## 2022-09-26 ENCOUNTER — Encounter: Payer: Self-pay | Admitting: *Deleted

## 2022-09-26 ENCOUNTER — Ambulatory Visit: Payer: 59 | Admitting: *Deleted

## 2022-09-26 DIAGNOSIS — M25662 Stiffness of left knee, not elsewhere classified: Secondary | ICD-10-CM | POA: Diagnosis not present

## 2022-09-26 DIAGNOSIS — M25562 Pain in left knee: Secondary | ICD-10-CM

## 2022-09-26 NOTE — Therapy (Signed)
OUTPATIENT PHYSICAL THERAPY LOWER EXTREMITY TREATMENT   Patient Name: Hannah Lane MRN: [id number] DOB:[birth year], 57 y.o., female Today's Date: 09/26/2022  END OF SESSION:  PT End of Session - 09/26/22 1120     Visit Number 6    Number of Visits 12    Date for PT Re-Evaluation 11/03/22    PT Start Time 1115    PT Stop Time 1205    PT Time Calculation (min) 50 min             Past Medical History:  Diagnosis Date   Hypertension    Kidney stones    Rheumatoid arthritis (HCC)    Thyroid disease    Past Surgical History:  Procedure Laterality Date   ABDOMINAL HYSTERECTOMY     CHOLECYSTECTOMY     Patient Active Problem List   Diagnosis Date Noted   Asthma exacerbation 08/08/2018   Allergic rhinitis due to pollen 03/22/2018   Weight gain 03/15/2017   BMI 38.0-38.9,adult 03/15/2017   Metabolic syndrome 03/15/2017   Well adult exam 09/15/2016   Class 3 severe obesity due to excess calories with serious comorbidity and body mass index (BMI) of 40.0 to 44.9 in adult (HCC) 09/15/2016   HTN (hypertension), benign 02/08/2016   Rheumatoid arthritis (HCC) 02/08/2016   Hypothyroidism 02/08/2016   Morbid obesity (HCC) 02/08/2016   Vitamin B12 deficiency 02/08/2016   REFERRING PROVIDER: Waylan Boga, MD  REFERRING DIAG: Primary osteoarthritis of left knee  THERAPY DIAG:  Stiffness of left knee, not elsewhere classified  Acute pain of left knee  Rationale for Evaluation and Treatment: Rehabilitation  ONSET DATE: 08/30/22  SUBJECTIVE:   SUBJECTIVE STATEMENT: Pt denies any pain today.  MD yesterday very pleased and stated I could DC PT after this week    PERTINENT HISTORY: HTN and RA PAIN:  Are you having pain? No  PRECAUTIONS: None  WEIGHT BEARING RESTRICTIONS: No  FALLS:  Has patient fallen in last 6 months? No  LIVING ENVIRONMENT: Lives with: lives with their spouse Lives in: House/apartment Stairs: Yes: External: 2 steps; none; step to pattern,  up with the good and down with the bad Has following equipment at home: Single point cane and Walker - 2 wheeled  OCCUPATION: Data processing manager; primarily a desk job  PLOF: Independent  PATIENT GOALS: improved mobility and be able walk without an assistive device  NEXT MD VISIT: 09/25/22  OBJECTIVE:   PATIENT SURVEYS:  FOTO 44.45  COGNITION: Overall cognitive status: Within functional limits for tasks assessed     SENSATION: Patient reports that she has some numbness in spots around her knee.   PALPATION: TTP: right lateral joint line  LOWER EXTREMITY ROM:  Active ROM Right eval Left eval  Hip flexion    Hip extension    Hip abduction    Hip adduction    Hip internal rotation    Hip external rotation    Knee flexion 126 80/ 81 (PROM)   Knee extension 0 26  Ankle dorsiflexion    Ankle plantarflexion    Ankle inversion    Ankle eversion     (Blank rows = not tested)  LOWER EXTREMITY MMT: not tested due to surgical condition, but unable to perform straight leg raise  GAIT: Assistive device utilized: Single point cane Level of assistance: Modified independence Comments: Left knee flexed in stance phase, absent hip extension bilaterally, decreased stride length, and stance phase   TODAY'S TREATMENT:  DATE:                                     09-26-22 EXERCISE LOG   LT knee     Exercise Repetitions and Resistance Comments  Nustep Seat 6-5 x 15 mins L3   Lunges 10" box x 3 mins   Forward Step Ups 6" box x 30 reps    Lateral Step Ups 6" box x 20 reps   Heel/Toe Raises x20 reps each   Rocker board x3 mins PF/DF calf stretching   LAQ    Seated marches    Knee ext 10# 3x15   Knee flexion 20# 3x15   Heel Slides    SLR     Blank cell = exercise not performed today   Modalities  Date:  Vaso: Knee, 34 degrees, 15 mins, Pain and  Edema   PATIENT EDUCATION:  Education details: HEP, POC, healing, prognosis, anatomy, and goals for therapy  Person educated: Patient Education method: Explanation Education comprehension: verbalized understanding  HOME EXERCISE PROGRAM: [id number]  ASSESSMENT:  CLINICAL IMPRESSION: Pt reports MD was pleased with status and that she can DC PT after this week. Pt did well with progression of exs with added knee ext and flexion machines. No increased pain after session. Mainly mm fatigue. On hold after next visit.   OBJECTIVE IMPAIRMENTS: Abnormal gait, decreased activity tolerance, decreased mobility, difficulty walking, decreased ROM, decreased strength, hypomobility, increased edema, impaired flexibility, impaired sensation, impaired tone, and pain.   ACTIVITY LIMITATIONS: carrying, standing, squatting, stairs, transfers, and locomotion level  PARTICIPATION LIMITATIONS: cleaning, laundry, driving, shopping, community activity, occupation, and yard work  PERSONAL FACTORS: 1-2 comorbidities: HTN and RA  are also affecting patient's functional outcome.   REHAB POTENTIAL: Good  CLINICAL DECISION MAKING: Stable/uncomplicated  EVALUATION COMPLEXITY: Low   GOALS: Goals reviewed with patient? Yes  SHORT TERM GOALS: Target date: 09/26/22 Patient will be independent with her initial HEP.  Baseline: Goal status: MET  2.  Patient will be able to achieve active left knee extension within 10 degrees of neutral for improved gait mechanics.  Baseline:  Goal status: MET  3.  Patient will be able to demonstrate at least 105 degrees of active left knee flexion for improved knee mobility.  Baseline:  Goal status: MET  4.  Patient will be able to ambulate at least 80 feet without an assistive device for improved household mobility.  Baseline:  Goal status: MET  LONG TERM GOALS: Target date: 10/17/22  Patient will be independent with her advanced HEP.  Baseline:  Goal status: IN  PROGRESS   2.  Patient will be able to navigate at least 3 steps with a reciprocal pattern for improved household mobility.  Baseline:  Goal status: MET  3.  Patient will be able to demonstrate at least 120 degrees of active left knee flexion for improved function navigating stairs.  Baseline: 5/16: 105 degrees Goal status: IN PROGRESS  4.  Patient will be able to demonstrate active left knee extension with 5 degrees of neutral for improved gait mechanics.  Baseline: 5/16: -4 degrees Goal status: MET  PLAN:  PT FREQUENCY: 2x/week  PT DURATION: 6 weeks  PLANNED INTERVENTIONS: Therapeutic exercises, Therapeutic activity, Neuromuscular re-education, Balance training, Gait training, Patient/Family education, Self Care, Joint mobilization, Stair training, Electrical stimulation, Cryotherapy, Moist heat, Vasopneumatic device, Manual therapy, and Re-evaluation  PLAN FOR NEXT SESSION: Nustep, focus on  knee extension, lower extremity strengthening, manual therapy, and modalities as needed   Stepheni Cameron,CHRIS, PTA 09/26/2022, 1:14 PM

## 2022-09-28 ENCOUNTER — Ambulatory Visit: Payer: 59

## 2022-09-28 DIAGNOSIS — M25662 Stiffness of left knee, not elsewhere classified: Secondary | ICD-10-CM

## 2022-09-28 DIAGNOSIS — M25562 Pain in left knee: Secondary | ICD-10-CM

## 2022-09-28 NOTE — Therapy (Addendum)
OUTPATIENT PHYSICAL THERAPY LOWER EXTREMITY TREATMENT   Patient Name: Hannah Lane MRN: [id number] DOB:1966-01-24, 57 y.o., female Today's Date: 09/28/2022  END OF SESSION:  PT End of Session - 09/28/22 0955     Visit Number 7    Number of Visits 12    Date for PT Re-Evaluation 11/03/22    PT Start Time 0952    PT Stop Time 1038    PT Time Calculation (min) 46 min             Past Medical History:  Diagnosis Date   Hypertension    Kidney stones    Rheumatoid arthritis (HCC)    Thyroid disease    Past Surgical History:  Procedure Laterality Date   ABDOMINAL HYSTERECTOMY     CHOLECYSTECTOMY     Patient Active Problem List   Diagnosis Date Noted   Asthma exacerbation 08/08/2018   Allergic rhinitis due to pollen 03/22/2018   Weight gain 03/15/2017   BMI 38.0-38.9,adult 03/15/2017   Metabolic syndrome 03/15/2017   Well adult exam 09/15/2016   Class 3 severe obesity due to excess calories with serious comorbidity and body mass index (BMI) of 40.0 to 44.9 in adult (HCC) 09/15/2016   HTN (hypertension), benign 02/08/2016   Rheumatoid arthritis (HCC) 02/08/2016   Hypothyroidism 02/08/2016   Morbid obesity (HCC) 02/08/2016   Vitamin B12 deficiency 02/08/2016   REFERRING PROVIDER: Waylan Boga, MD  REFERRING DIAG: Primary osteoarthritis of left knee  THERAPY DIAG:  Stiffness of left knee, not elsewhere classified  Acute pain of left knee  Rationale for Evaluation and Treatment: Rehabilitation  ONSET DATE: 08/30/22  SUBJECTIVE:   SUBJECTIVE STATEMENT: Pt denies any pain today.  Ready for discharge today.   PERTINENT HISTORY: HTN and RA PAIN:  Are you having pain? No  PRECAUTIONS: None  WEIGHT BEARING RESTRICTIONS: No  FALLS:  Has patient fallen in last 6 months? No  LIVING ENVIRONMENT: Lives with: lives with their spouse Lives in: House/apartment Stairs: Yes: External: 2 steps; none; step to pattern, up with the good and down with the  bad Has following equipment at home: Single point cane and Walker - 2 wheeled  OCCUPATION: Data processing manager; primarily a desk job  PLOF: Independent  PATIENT GOALS: improved mobility and be able walk without an assistive device  NEXT MD VISIT: 09/25/22  OBJECTIVE:   PATIENT SURVEYS:  FOTO 44.45  COGNITION: Overall cognitive status: Within functional limits for tasks assessed     SENSATION: Patient reports that she has some numbness in spots around her knee.   PALPATION: TTP: right lateral joint line  LOWER EXTREMITY ROM:  Active ROM Right eval Left eval  Hip flexion    Hip extension    Hip abduction    Hip adduction    Hip internal rotation    Hip external rotation    Knee flexion 126 80/ 81 (PROM)   Knee extension 0 26  Ankle dorsiflexion    Ankle plantarflexion    Ankle inversion    Ankle eversion     (Blank rows = not tested)  LOWER EXTREMITY MMT: not tested due to surgical condition, but unable to perform straight leg raise  GAIT: Assistive device utilized: Single point cane Level of assistance: Modified independence Comments: Left knee flexed in stance phase, absent hip extension bilaterally, decreased stride length, and stance phase   TODAY'S TREATMENT:  DATE:                                     09-26-22 EXERCISE LOG   LT knee     Exercise Repetitions and Resistance Comments  Recumbent Bike Seat 5; lvl 2 x 15 mins   Lunges 10" box x 3 mins   Forward Step Ups 6" box x 30 reps    Lateral Step Ups 6" box x 20 reps   Heel/Toe Raises x20 reps each   Rocker board x3 mins PF/DF calf stretching   LAQ    Seated marches    Knee ext 10# 3 mins   Knee flexion 20# 3 mins   Heel Slides    SLR     Blank cell = exercise not performed today   Modalities  Date:  Vaso: Knee, 34 degrees, 15 mins, Pain and Edema   PATIENT EDUCATION:   Education details: HEP, POC, healing, prognosis, anatomy, and goals for therapy  Person educated: Patient Education method: Explanation Education comprehension: verbalized understanding  HOME EXERCISE PROGRAM: [id number]  ASSESSMENT:  CLINICAL IMPRESSION: Pt arrives for today's treatment session denying any pain.  Pt able to increase FOTO score to 76 today.  Pt has met all of the goals set forth for her at this time.  Pt able to tolerate increased time with cybex exercises today without issue.  Normal responses to vaso noted upon removal.  Pt encouraged to call facility with any questions or concerns.  Pt ready for discharge at this time.   PHYSICAL THERAPY DISCHARGE SUMMARY  Visits from Start of Care: 7  Current functional level related to goals / functional outcomes: Patient was able to meet all of her goals for skilled physical therapy.    Remaining deficits: None    Education / Equipment: HEP   Patient agrees to discharge. Patient goals were met. Patient is being discharged due to meeting the stated rehab goals.  Candi Leash, PT, DPT    OBJECTIVE IMPAIRMENTS: Abnormal gait, decreased activity tolerance, decreased mobility, difficulty walking, decreased ROM, decreased strength, hypomobility, increased edema, impaired flexibility, impaired sensation, impaired tone, and pain.   ACTIVITY LIMITATIONS: carrying, standing, squatting, stairs, transfers, and locomotion level  PARTICIPATION LIMITATIONS: cleaning, laundry, driving, shopping, community activity, occupation, and yard work  PERSONAL FACTORS: 1-2 comorbidities: HTN and RA  are also affecting patient's functional outcome.   REHAB POTENTIAL: Good  CLINICAL DECISION MAKING: Stable/uncomplicated  EVALUATION COMPLEXITY: Low   GOALS: Goals reviewed with patient? Yes  SHORT TERM GOALS: Target date: 09/26/22 Patient will be independent with her initial HEP.  Baseline: Goal status: MET  2.  Patient will be able to  achieve active left knee extension within 10 degrees of neutral for improved gait mechanics.  Baseline:  Goal status: MET  3.  Patient will be able to demonstrate at least 105 degrees of active left knee flexion for improved knee mobility.  Baseline:  Goal status: MET  4.  Patient will be able to ambulate at least 80 feet without an assistive device for improved household mobility.  Baseline:  Goal status: MET  LONG TERM GOALS: Target date: 10/17/22  Patient will be independent with her advanced HEP.  Baseline:  Goal status: MET   2.  Patient will be able to navigate at least 3 steps with a reciprocal pattern for improved household mobility.  Baseline:  Goal status: MET  3.  Patient will be able to demonstrate at least 120 degrees of active left knee flexion for improved function navigating stairs.  Baseline: 5/16: 105 degrees; 5/23: 120 degrees Goal status: MET  4.  Patient will be able to demonstrate active left knee extension with 5 degrees of neutral for improved gait mechanics.  Baseline: 5/16: -4 degrees Goal status: MET  PLAN:  PT FREQUENCY: 2x/week  PT DURATION: 6 weeks  PLANNED INTERVENTIONS: Therapeutic exercises, Therapeutic activity, Neuromuscular re-education, Balance training, Gait training, Patient/Family education, Self Care, Joint mobilization, Stair training, Electrical stimulation, Cryotherapy, Moist heat, Vasopneumatic device, Manual therapy, and Re-evaluation  PLAN FOR NEXT SESSION: Nustep, focus on knee extension, lower extremity strengthening, manual therapy, and modalities as needed   Newman Pies, PTA 09/28/2022, 10:48 AM
